# Patient Record
Sex: Male | Born: 1955 | Race: White | Hispanic: No | Marital: Married | Smoking: Former smoker
Health system: Southern US, Community
[De-identification: ages and names within clinical notes are randomized; demographics above are authoritative.]

## PROBLEM LIST (undated history)

## (undated) DIAGNOSIS — N529 Male erectile dysfunction, unspecified: Secondary | ICD-10-CM

## (undated) DIAGNOSIS — E291 Testicular hypofunction: Secondary | ICD-10-CM

## (undated) DIAGNOSIS — M51369 Other intervertebral disc degeneration, lumbar region without mention of lumbar back pain or lower extremity pain: Secondary | ICD-10-CM

## (undated) DIAGNOSIS — F1120 Opioid dependence, uncomplicated: Secondary | ICD-10-CM

## (undated) DIAGNOSIS — E782 Mixed hyperlipidemia: Secondary | ICD-10-CM

## (undated) DIAGNOSIS — I10 Essential (primary) hypertension: Secondary | ICD-10-CM

## (undated) DIAGNOSIS — F1911 Other psychoactive substance abuse, in remission: Secondary | ICD-10-CM

## (undated) DIAGNOSIS — M1990 Unspecified osteoarthritis, unspecified site: Secondary | ICD-10-CM

## (undated) DIAGNOSIS — F419 Anxiety disorder, unspecified: Secondary | ICD-10-CM

## (undated) DIAGNOSIS — M5136 Other intervertebral disc degeneration, lumbar region: Secondary | ICD-10-CM

## (undated) DIAGNOSIS — F32A Depression, unspecified: Secondary | ICD-10-CM

## (undated) DIAGNOSIS — K30 Functional dyspepsia: Secondary | ICD-10-CM

## (undated) DIAGNOSIS — F1020 Alcohol dependence, uncomplicated: Secondary | ICD-10-CM

## (undated) DIAGNOSIS — K220 Achalasia of cardia: Secondary | ICD-10-CM

## (undated) DIAGNOSIS — K219 Gastro-esophageal reflux disease without esophagitis: Secondary | ICD-10-CM

## (undated) DIAGNOSIS — T8859XA Other complications of anesthesia, initial encounter: Secondary | ICD-10-CM

## (undated) DIAGNOSIS — F1910 Other psychoactive substance abuse, uncomplicated: Secondary | ICD-10-CM

## (undated) DIAGNOSIS — K5903 Drug induced constipation: Secondary | ICD-10-CM

## (undated) DIAGNOSIS — G4730 Sleep apnea, unspecified: Secondary | ICD-10-CM

## (undated) HISTORY — PX: SHOULDER SURGERY: SHX246

## (undated) HISTORY — PX: BACK SURGERY: SHX140

## (undated) HISTORY — PX: LUMBAR SPINE SURGERY: SHX701

## (undated) HISTORY — PX: NASAL SINUS SURGERY: SHX719

---

## 1976-03-21 HISTORY — PX: ANKLE SURGERY: SHX546

## 1998-12-21 ENCOUNTER — Ambulatory Visit (HOSPITAL_COMMUNITY): Admission: RE | Admit: 1998-12-21 | Discharge: 1998-12-21 | Payer: Self-pay | Admitting: Orthopedic Surgery

## 1998-12-21 ENCOUNTER — Encounter: Payer: Self-pay | Admitting: Orthopedic Surgery

## 1998-12-28 ENCOUNTER — Encounter: Admission: RE | Admit: 1998-12-28 | Discharge: 1998-12-28 | Payer: Self-pay | Admitting: Orthopedic Surgery

## 1999-04-16 ENCOUNTER — Encounter: Admission: RE | Admit: 1999-04-16 | Discharge: 1999-05-10 | Payer: Self-pay | Admitting: Orthopedic Surgery

## 2003-05-28 ENCOUNTER — Ambulatory Visit (HOSPITAL_COMMUNITY): Admission: RE | Admit: 2003-05-28 | Discharge: 2003-05-28 | Payer: Self-pay | Admitting: Gastroenterology

## 2003-05-28 ENCOUNTER — Encounter (INDEPENDENT_AMBULATORY_CARE_PROVIDER_SITE_OTHER): Payer: Self-pay | Admitting: Specialist

## 2006-10-10 ENCOUNTER — Encounter: Admission: RE | Admit: 2006-10-10 | Discharge: 2006-10-10 | Payer: Self-pay | Admitting: Occupational Medicine

## 2009-05-06 ENCOUNTER — Emergency Department (HOSPITAL_COMMUNITY): Admission: EM | Admit: 2009-05-06 | Discharge: 2009-05-06 | Payer: Self-pay | Admitting: Emergency Medicine

## 2010-01-16 ENCOUNTER — Emergency Department (HOSPITAL_COMMUNITY): Admission: EM | Admit: 2010-01-16 | Discharge: 2010-01-16 | Payer: Self-pay | Admitting: Emergency Medicine

## 2010-04-10 ENCOUNTER — Encounter: Payer: Self-pay | Admitting: Family Medicine

## 2010-05-14 ENCOUNTER — Ambulatory Visit: Payer: Self-pay

## 2010-05-14 ENCOUNTER — Other Ambulatory Visit: Payer: Self-pay | Admitting: Occupational Medicine

## 2010-05-14 DIAGNOSIS — R52 Pain, unspecified: Secondary | ICD-10-CM

## 2010-06-02 LAB — COMPREHENSIVE METABOLIC PANEL
ALT: 26 U/L (ref 0–53)
AST: 26 U/L (ref 0–37)
Albumin: 4.2 g/dL (ref 3.5–5.2)
CO2: 30 mEq/L (ref 19–32)
Calcium: 8.8 mg/dL (ref 8.4–10.5)
GFR calc Af Amer: >60 mL/min (ref >60)
GFR calc non Af Amer: >60 mL/min (ref >60)
Sodium: 137 mEq/L (ref 135–145)
Total Protein: 6.7 g/dL (ref 6.0–8.3)

## 2010-06-02 LAB — DIFFERENTIAL
Eosinophils Absolute: 0.2 10*3/uL (ref 0.0–0.7)
Eosinophils Relative: 3 % (ref 0–5)
Lymphocytes Relative: 31 % (ref 12–46)
Lymphs Abs: 2.2 10*3/uL (ref 0.7–4.0)
Monocytes Absolute: 0.6 10*3/uL (ref 0.1–1.0)
Monocytes Relative: 8 % (ref 3–12)

## 2010-06-02 LAB — URINALYSIS, ROUTINE W REFLEX MICROSCOPIC
Bilirubin Urine: NEGATIVE
Hgb urine dipstick: NEGATIVE
Nitrite: NEGATIVE
Protein, ur: NEGATIVE mg/dL
Specific Gravity, Urine: 1.019 (ref 1.005–1.030)
Urobilinogen, UA: 0.2 mg/dL (ref 0.0–1.0)

## 2010-06-02 LAB — POCT I-STAT, CHEM 8
BUN: 8 mg/dL (ref 6–23)
Chloride: 100 mEq/L (ref 96–112)
Creatinine, Ser: 0.9 mg/dL (ref 0.4–1.5)
Glucose, Bld: 94 mg/dL (ref 70–99)
Potassium: 3.8 mEq/L (ref 3.5–5.1)
Sodium: 139 mEq/L (ref 135–145)

## 2010-06-02 LAB — ETHANOL: Alcohol, Ethyl (B): <5 mg/dL (ref 0–10)

## 2010-06-02 LAB — CBC
Hemoglobin: 14.5 g/dL (ref 13.0–17.0)
MCHC: 35.1 g/dL (ref 30.0–36.0)
Platelets: 171 10*3/uL (ref 150–400)

## 2010-06-02 LAB — ABO/RH: ABO/RH(D): B POS

## 2010-06-02 LAB — TYPE AND SCREEN: Antibody Screen: NEGATIVE

## 2010-08-06 NOTE — Op Note (Signed)
NAME:  Tyler Coleman, Tyler Coleman                     ACCOUNT NO.:  [id number]   MEDICAL RECORD NO.:  [id number]                   PATIENT TYPE:  AMB   LOCATION:  ENDO                                 FACILITY:  [facility]   PHYSICIAN:  Petra Kuba, M.D.                 DATE OF BIRTH:  July 17, 1955   DATE OF PROCEDURE:  05/28/2003  DATE OF DISCHARGE:                                 OPERATIVE REPORT   PROCEDURE:  Colonoscopy with polypectomy.   INDICATION:  Family history of colon polyps.  Consent was signed after  risks, benefits, methods, and options thoroughly discussed in the office.   MEDICINES USED:  1. Demerol 120.  2. Versed 12 mg.   DESCRIPTION OF PROCEDURE:  Rectal inspection was pertinent for external  hemorrhoids, small.  Digital exam was negative.  Video colonoscope was  inserted and easily advanced around the colon to the cecum.  This did  require some abdominal pressure but no position changes.  No obvious  abnormality was seen on insertion.  The cecum was identified by the  appendiceal orifice and the ileocecal valve.  The prep was adequate.  There  was some liquid stool that required washing and suctioning.  On slow  withdrawal through the colon, a small pedunculated cecal polyp was seen,  snared, electrocautery applied on a setting of 150/15.  The polyp was  removed, suctioned through the scope and collected in the trap.  There was a  nice, white coagulum without any obvious residual polypoid tissue.  One fold  away from the cecum, was a small polyp.  Initially, we snared it, and part  of it was removed.  After electrocautery was applied, there seemed to be a  little bit of residual polypoid tissue which was cold biopsied, but these  two polyps were put in the same container.  The scope was further withdrawn.  In the mid transverse, another tiny polyp was seen and was hot biopsied x 1.  There was another tiny one on the distal sigmoid.  Both of these were put in  separate  bottles.  No other abnormalities were seen as we slowly withdrew  back to the rectum.  Once back in the rectum, anorectal pull-through and  retroflexion confirmed some small hemorrhoids.  The scope was reinserted a  short ways up the left side of the colon; air was suctioned and scope  removed.  The patient tolerated the procedure well.  There was no obvious  immediate complication.   ENDOSCOPIC DIAGNOSES:  1. Internal-external small hemorrhoids.  2. Three tiny polyps with hot biopsies in the distal sigmoid, mid     transverse, had a snare and cold biopsy in the ascending.  3. One small polyp in the cecum snared.  4. Otherwise, within normal limits to the cecum.   PLAN:  1. Await pathology to determine future colonic screening.  2. Happy to see back p.r.n.  3. Otherwise,  return care to Dr. Doristine Counter for the customary health care     maintenance to include yearly rectals and guaiacs.                                               Petra Kuba, M.D.    MEM/MEDQ  D:  05/28/2003  T:  05/28/2003  Job:  [id number]   cc:   Marjory Lies, M.D.  [address]  [place]  [place] [address]  Fax: [phone]

## 2010-09-09 ENCOUNTER — Ambulatory Visit: Payer: Self-pay

## 2010-09-09 ENCOUNTER — Other Ambulatory Visit: Payer: Self-pay | Admitting: Occupational Medicine

## 2010-09-09 DIAGNOSIS — M549 Dorsalgia, unspecified: Secondary | ICD-10-CM

## 2014-04-14 ENCOUNTER — Other Ambulatory Visit: Payer: Self-pay | Admitting: Dermatology

## 2015-05-13 DIAGNOSIS — G4700 Insomnia, unspecified: Secondary | ICD-10-CM | POA: Insufficient documentation

## 2015-05-13 DIAGNOSIS — E291 Testicular hypofunction: Secondary | ICD-10-CM | POA: Insufficient documentation

## 2015-05-13 DIAGNOSIS — J309 Allergic rhinitis, unspecified: Secondary | ICD-10-CM | POA: Insufficient documentation

## 2015-10-28 ENCOUNTER — Encounter (HOSPITAL_COMMUNITY): Payer: Self-pay

## 2015-10-28 ENCOUNTER — Emergency Department (HOSPITAL_COMMUNITY)
Admission: EM | Admit: 2015-10-28 | Discharge: 2015-10-28 | Disposition: A | Discharge status: Home / Self Care | Payer: Commercial Managed Care - HMO | Attending: Emergency Medicine | Admitting: Emergency Medicine

## 2015-10-28 DIAGNOSIS — Z79899 Other long term (current) drug therapy: Secondary | ICD-10-CM | POA: Diagnosis not present

## 2015-10-28 DIAGNOSIS — F1490 Cocaine use, unspecified, uncomplicated: Secondary | ICD-10-CM | POA: Diagnosis not present

## 2015-10-28 DIAGNOSIS — F17210 Nicotine dependence, cigarettes, uncomplicated: Secondary | ICD-10-CM | POA: Insufficient documentation

## 2015-10-28 DIAGNOSIS — T401X1A Poisoning by heroin, accidental (unintentional), initial encounter: Secondary | ICD-10-CM | POA: Insufficient documentation

## 2015-10-28 DIAGNOSIS — R4020 Unspecified coma: Secondary | ICD-10-CM | POA: Diagnosis present

## 2015-10-28 HISTORY — DX: Other psychoactive substance abuse, in remission: F19.11

## 2015-10-28 LAB — BASIC METABOLIC PANEL
ANION GAP: 7 (ref 5–15)
BUN: 9 mg/dL (ref 6–20)
CHLORIDE: 105 mmol/L (ref 101–111)
CO2: 28 mmol/L (ref 22–32)
CREATININE: 1.02 mg/dL (ref 0.61–1.24)
Calcium: 9 mg/dL (ref 8.9–10.3)
GFR calc non Af Amer: >60 mL/min (ref >60)
Glucose, Bld: 110 mg/dL — ABNORMAL HIGH (ref 65–99)
POTASSIUM: 3.3 mmol/L — AB (ref 3.5–5.1)
Sodium: 140 mmol/L (ref 135–145)

## 2015-10-28 LAB — RAPID URINE DRUG SCREEN, HOSP PERFORMED
AMPHETAMINES: NOT DETECTED
BARBITURATES: NOT DETECTED
BENZODIAZEPINES: POSITIVE — AB
Cocaine: POSITIVE — AB
Opiates: POSITIVE — AB
TETRAHYDROCANNABINOL: NOT DETECTED

## 2015-10-28 LAB — CBC WITH DIFFERENTIAL/PLATELET
Basophils Absolute: 0 10*3/uL (ref 0.0–0.1)
Basophils Relative: 0 %
Eosinophils Absolute: 0.1 10*3/uL (ref 0.0–0.7)
Eosinophils Relative: 1 %
HEMATOCRIT: 37.9 % — AB (ref 39.0–52.0)
HEMOGLOBIN: 13.3 g/dL (ref 13.0–17.0)
LYMPHS ABS: 1.5 10*3/uL (ref 0.7–4.0)
LYMPHS PCT: 15 %
MCH: 29.6 pg (ref 26.0–34.0)
MCHC: 35.1 g/dL (ref 30.0–36.0)
MCV: 84.4 fL (ref 78.0–100.0)
MONOS PCT: 8 %
Monocytes Absolute: 0.9 10*3/uL (ref 0.1–1.0)
NEUTROS ABS: 8 10*3/uL — AB (ref 1.7–7.7)
NEUTROS PCT: 76 %
Platelets: 166 10*3/uL (ref 150–400)
RBC: 4.49 MIL/uL (ref 4.22–5.81)
RDW: 12.8 % (ref 11.5–15.5)
WBC: 10.4 10*3/uL (ref 4.0–10.5)

## 2015-10-28 LAB — ETHANOL: Alcohol, Ethyl (B): <5 mg/dL (ref <5)

## 2015-10-28 NOTE — ED Provider Notes (Signed)
[facility]-EMERGENCY DEPT Provider Note   CSN: [id number] Arrival date & time: 10/28/15  1743  First Provider Contact:  None       History   Chief Complaint Chief Complaint  Patient presents with  . Drug Overdose    HPI Tyler PileClarence K Coleman is a 60 y.o. male.  Patient is a 60 year old male brought by EMS for evaluation of an apparent overdose of heroin and Xanax. He reports snorting heroin in his vehicle where he was found unresponsive by a bystander. EMS was called. He was given Narcan and woke up shortly afterward. The patient denies to me he was making attempts to harm himself and that he inadvertently took too much. He reports a history of back pain states that he was doing this to help with his pain.   The history is provided by the patient.  Drug Overdose  This is a new problem. The current episode started less than 1 hour ago. The problem occurs constantly. The problem has been resolved. Pertinent negatives include no chest pain and no abdominal pain. Nothing aggravates the symptoms. Treatments tried: narcan. The treatment provided significant relief.    Past Medical History:  Diagnosis Date  . Hx of mixed drug abuse     There are no active problems to display for this patient.   Past Surgical History:  Procedure Laterality Date  . BACK SURGERY         Home Medications    Prior to Admission medications   Medication Sig Start Date End Date Taking? Authorizing Provider  ALPRAZolam Prudy Feeler(XANAX) 0.5 MG tablet Take 0.5-1 mg by mouth 4 (four) times daily as needed for anxiety.  10/12/15  Yes Historical Provider, MD  pantoprazole (PROTONIX) 40 MG tablet Take 40 mg by mouth daily. 09/25/15  Yes Historical Provider, MD  SUBOXONE 2-0.5 MG FILM Place 0.25 Film under the tongue daily. 09/30/15  Yes Historical Provider, MD  VIIBRYD 40 MG TABS Take 40 mg by mouth daily. 09/28/15  Yes Historical Provider, MD  zolpidem (AMBIEN) 10 MG tablet Take 10 mg by mouth at bedtime as needed for sleep.   10/02/15  Yes Historical Provider, MD    Family History Family History  Problem Relation Age of Onset  . Heart failure Mother   . Heart failure Father     Social History Social History  Substance Use Topics  . Smoking status: Light Tobacco Smoker    Types: Cigarettes  . Smokeless tobacco: Never Used  . Alcohol use 1.8 oz/week    3 Cans of beer per week     Allergies   Review of patient's allergies indicates no known allergies.   Review of Systems Review of Systems  Cardiovascular: Negative for chest pain.  Gastrointestinal: Negative for abdominal pain.  All other systems reviewed and are negative.    Physical Exam Updated Vital Signs BP 127/70 (BP Location: Left Arm)   Temp 98.6 F (37 C)   Resp 20   Ht 6' (1.829 m)   Wt 215 lb (97.5 kg)   SpO2 98%   BMI 29.16 kg/m   Physical Exam  Constitutional: He is oriented to person, place, and time. He appears well-developed and well-nourished. No distress.  HENT:  Head: Normocephalic and atraumatic.  Mouth/Throat: Oropharynx is clear and moist.  Neck: Normal range of motion. Neck supple.  Cardiovascular: Normal rate and regular rhythm.  Exam reveals no friction rub.   No murmur heard. Pulmonary/Chest: Effort normal and breath sounds normal. No  respiratory distress. He has no wheezes. He has no rales.  Abdominal: Soft. Bowel sounds are normal. He exhibits no distension. There is no tenderness.  Musculoskeletal: Normal range of motion. He exhibits no edema.  Neurological: He is alert and oriented to person, place, and time. Coordination normal.  Skin: Skin is warm and dry. He is not diaphoretic.  Nursing note and vitals reviewed.    ED Treatments / Results  Labs (all labs ordered are listed, but only abnormal results are displayed) Labs Reviewed  BASIC METABOLIC PANEL  CBC WITH DIFFERENTIAL/PLATELET  ETHANOL  URINE RAPID DRUG SCREEN, HOSP PERFORMED    EKG  EKG Interpretation None       Radiology No  results found.  Procedures Procedures (including critical care time)  Medications Ordered in ED Medications - No data to display   Initial Impression / Assessment and Plan / ED Course  I have reviewed the triage vital signs and the nursing notes.  Pertinent labs & imaging results that were available during my care of the patient were reviewed by me and considered in my medical decision making (see chart for details).  Clinical Course      Final Clinical Impressions(s) / ED Diagnoses   Final diagnoses:  None   Patient denies this was a suicide attempt. He is becoming agitated and demanding to go home. He has been observed for the past 2 hours and appears to be doing well. He is conscious and alert after the Narcan has worn off. He will be discharged, to return as needed for any problems.  New Prescriptions New Prescriptions   No medications on file     Geoffery Lyons, MD 10/28/15 1945

## 2015-10-28 NOTE — ED Notes (Signed)
Bed: [id number] Expected date:  Expected time:  Means of arrival:  Comments: 60 yo heroin OD

## 2015-10-28 NOTE — Progress Notes (Addendum)
Patient listed as not having insurance or a pcp.  EDCM spoke to patient at bedside.  Patient reports his pcp is Dr. Doristine CounterBurnett. Patient reports he has insurance.   [facility].

## 2015-10-28 NOTE — ED Triage Notes (Signed)
Per EMS Patient was found by a lifeguard in a parking lot [place] a community [place] unresponsive. Patient was given Narcan 2 mg IN and now patient awake and alert. Patient admitted to using heroin and xanax for complaints of back pain.

## 2016-02-16 DIAGNOSIS — E782 Mixed hyperlipidemia: Secondary | ICD-10-CM | POA: Insufficient documentation

## 2016-03-14 ENCOUNTER — Emergency Department (HOSPITAL_COMMUNITY)
Admission: EM | Admit: 2016-03-14 | Discharge: 2016-03-15 | Disposition: A | Discharge status: Other Acute Inpatient Hospital | Payer: Commercial Managed Care - HMO | Attending: Emergency Medicine | Admitting: Emergency Medicine

## 2016-03-14 DIAGNOSIS — F332 Major depressive disorder, recurrent severe without psychotic features: Secondary | ICD-10-CM | POA: Insufficient documentation

## 2016-03-14 DIAGNOSIS — F17210 Nicotine dependence, cigarettes, uncomplicated: Secondary | ICD-10-CM | POA: Insufficient documentation

## 2016-03-14 DIAGNOSIS — Z79899 Other long term (current) drug therapy: Secondary | ICD-10-CM | POA: Insufficient documentation

## 2016-03-14 DIAGNOSIS — R45851 Suicidal ideations: Secondary | ICD-10-CM | POA: Diagnosis present

## 2016-03-14 LAB — CBC
HCT: 39 % (ref 39.0–52.0)
Hemoglobin: 13.9 g/dL (ref 13.0–17.0)
MCH: 30 pg (ref 26.0–34.0)
MCHC: 35.6 g/dL (ref 30.0–36.0)
MCV: 84.1 fL (ref 78.0–100.0)
PLATELETS: 196 10*3/uL (ref 150–400)
RBC: 4.64 MIL/uL (ref 4.22–5.81)
RDW: 12.6 % (ref 11.5–15.5)
WBC: 6.1 10*3/uL (ref 4.0–10.5)

## 2016-03-14 LAB — COMPREHENSIVE METABOLIC PANEL
ALBUMIN: 4.7 g/dL (ref 3.5–5.0)
ALT: 26 U/L (ref 17–63)
ANION GAP: 12 (ref 5–15)
AST: 25 U/L (ref 15–41)
Alkaline Phosphatase: 68 U/L (ref 38–126)
BUN: 9 mg/dL (ref 6–20)
CHLORIDE: 102 mmol/L (ref 101–111)
CO2: 25 mmol/L (ref 22–32)
Calcium: 8.8 mg/dL — ABNORMAL LOW (ref 8.9–10.3)
Creatinine, Ser: 0.81 mg/dL (ref 0.61–1.24)
GFR calc Af Amer: >60 mL/min (ref >60)
GFR calc non Af Amer: >60 mL/min (ref >60)
Glucose, Bld: 104 mg/dL — ABNORMAL HIGH (ref 65–99)
POTASSIUM: 3.3 mmol/L — AB (ref 3.5–5.1)
Sodium: 139 mmol/L (ref 135–145)
Total Bilirubin: 0.5 mg/dL (ref 0.3–1.2)
Total Protein: 7 g/dL (ref 6.5–8.1)

## 2016-03-14 LAB — ETHANOL: ALCOHOL ETHYL (B): 211 mg/dL — AB (ref <5)

## 2016-03-14 LAB — SALICYLATE LEVEL

## 2016-03-14 LAB — RAPID URINE DRUG SCREEN, HOSP PERFORMED
AMPHETAMINES: NOT DETECTED
BENZODIAZEPINES: NOT DETECTED
Barbiturates: NOT DETECTED
COCAINE: NOT DETECTED
OPIATES: NOT DETECTED
Tetrahydrocannabinol: NOT DETECTED

## 2016-03-14 LAB — ACETAMINOPHEN LEVEL

## 2016-03-14 NOTE — ED Notes (Signed)
Pt cursing and yelling at police and staff, calling us "chicken shits" and being uncooperative.

## 2016-03-14 NOTE — ED Provider Notes (Signed)
[facility]-EMERGENCY DEPT Provider Note   CSN: [id number] Arrival date & time: 03/14/16  2216     History   Chief Complaint Chief Complaint  Patient presents with  . Suicidal    HPI Tyler PileClarence K Coleman is a 60 y.o. male.  60 year old male who presents in custody of police after threatening to bear cans of his house as well as shoot other officers. Patient denies alcohol use this evening or illicit drug use. According to police, when they ran his house they found a large amount of guns. Patient reportedly had been requested for police to shoot him. He denies any suicidal or homicidal ideations. History is limited due to his current state.      Past Medical History:  Diagnosis Date  . Hx of mixed drug abuse     There are no active problems to display for this patient.   Past Surgical History:  Procedure Laterality Date  . BACK SURGERY         Home Medications    Prior to Admission medications   Medication Sig Start Date End Date Taking? Authorizing Provider  ALPRAZolam Prudy Feeler(XANAX) 0.5 MG tablet Take 0.5-1 mg by mouth 4 (four) times daily as needed for anxiety.  10/12/15   Historical Provider, MD  pantoprazole (PROTONIX) 40 MG tablet Take 40 mg by mouth daily. 09/25/15   Historical Provider, MD  SUBOXONE 2-0.5 MG FILM Place 0.25 Film under the tongue daily. 09/30/15   Historical Provider, MD  VIIBRYD 40 MG TABS Take 40 mg by mouth daily. 09/28/15   Historical Provider, MD  zolpidem (AMBIEN) 10 MG tablet Take 10 mg by mouth at bedtime as needed for sleep.  10/02/15   Historical Provider, MD    Family History Family History  Problem Relation Age of Onset  . Heart failure Mother   . Heart failure Father     Social History Social History  Substance Use Topics  . Smoking status: Light Tobacco Smoker    Types: Cigarettes  . Smokeless tobacco: Never Used  . Alcohol use 1.8 oz/week    3 Cans of beer per week     Allergies   Patient has no known allergies.   Review of  Systems Review of Systems  Unable to perform ROS: Psychiatric disorder     Physical Exam Updated Vital Signs BP 163/87   Pulse 89   Resp 17   SpO2 96%   Physical Exam  Constitutional: He is oriented to person, place, and time. He appears well-developed and well-nourished.  Non-toxic appearance. No distress.  HENT:  Head: Normocephalic and atraumatic.  Eyes: Conjunctivae, EOM and lids are normal. Pupils are equal, round, and reactive to light.  Neck: Normal range of motion. Neck supple. No tracheal deviation present. No thyroid mass present.  Cardiovascular: Normal rate, regular rhythm and normal heart sounds.  Exam reveals no gallop.   No murmur heard. Pulmonary/Chest: Effort normal and breath sounds normal. No stridor. No respiratory distress. He has no decreased breath sounds. He has no wheezes. He has no rhonchi. He has no rales.  Abdominal: Soft. Normal appearance and bowel sounds are normal. He exhibits no distension. There is no tenderness. There is no rebound and no CVA tenderness.  Musculoskeletal: Normal range of motion. He exhibits no edema or tenderness.  Neurological: He is alert and oriented to person, place, and time. He has normal strength. No cranial nerve deficit or sensory deficit. GCS eye subscore is 4. GCS verbal subscore is 5. GCS motor  subscore is 6.  Skin: Skin is warm and dry. No abrasion and no rash noted.  Psychiatric: His affect is angry. His speech is rapid and/or pressured. He is aggressive and combative. He expresses impulsivity. He is inattentive.  Nursing note and vitals reviewed.    ED Treatments / Results  Labs (all labs ordered are listed, but only abnormal results are displayed) Labs Reviewed  COMPREHENSIVE METABOLIC PANEL  ETHANOL  SALICYLATE LEVEL  ACETAMINOPHEN LEVEL  CBC  RAPID URINE DRUG SCREEN, HOSP PERFORMED    EKG  EKG Interpretation None       Radiology No results found.  Procedures Procedures (including critical care  time)  Medications Ordered in ED Medications - No data to display   Initial Impression / Assessment and Plan / ED Course  I have reviewed the triage vital signs and the nursing notes.  Pertinent labs & imaging results that were available during my care of the patient were reviewed by me and considered in my medical decision making (see chart for details).  Clinical Course     The patient's medical clearance labs are pending at this time and will be dispositioned by oncoming provider  Final Clinical Impressions(s) / ED Diagnoses   Final diagnoses:  None    New Prescriptions New Prescriptions   No medications on file     Lorre NickAnthony Itzy Adler, MD 03/14/16 2311

## 2016-03-14 NOTE — ED Notes (Addendum)
RN is drawing blood work 

## 2016-03-14 NOTE — ED Notes (Signed)
[facility]. Extremely intoxicated, called 911 and threatened to shoot self and any police if they showed up. Guns and covered windows found in home like he wanted to have a standoff with police. Pt has been repeatedly requesting police to shoot him.

## 2016-03-15 ENCOUNTER — Inpatient Hospital Stay (HOSPITAL_COMMUNITY)
Admission: AD | Admit: 2016-03-15 | Discharge: 2016-03-21 | DRG: 885 | Disposition: A | Discharge status: Home / Self Care | Payer: 59 | Source: Intra-hospital | Attending: Psychiatry | Admitting: Psychiatry

## 2016-03-15 DIAGNOSIS — Z79899 Other long term (current) drug therapy: Secondary | ICD-10-CM

## 2016-03-15 DIAGNOSIS — F332 Major depressive disorder, recurrent severe without psychotic features: Secondary | ICD-10-CM | POA: Diagnosis present

## 2016-03-15 DIAGNOSIS — F17210 Nicotine dependence, cigarettes, uncomplicated: Secondary | ICD-10-CM | POA: Diagnosis present

## 2016-03-15 DIAGNOSIS — Z818 Family history of other mental and behavioral disorders: Secondary | ICD-10-CM | POA: Diagnosis not present

## 2016-03-15 DIAGNOSIS — G4700 Insomnia, unspecified: Secondary | ICD-10-CM | POA: Diagnosis present

## 2016-03-15 DIAGNOSIS — K219 Gastro-esophageal reflux disease without esophagitis: Secondary | ICD-10-CM | POA: Diagnosis present

## 2016-03-15 DIAGNOSIS — Z87891 Personal history of nicotine dependence: Secondary | ICD-10-CM | POA: Diagnosis not present

## 2016-03-15 DIAGNOSIS — F1020 Alcohol dependence, uncomplicated: Secondary | ICD-10-CM | POA: Diagnosis present

## 2016-03-15 DIAGNOSIS — Z8249 Family history of ischemic heart disease and other diseases of the circulatory system: Secondary | ICD-10-CM

## 2016-03-15 DIAGNOSIS — F411 Generalized anxiety disorder: Secondary | ICD-10-CM | POA: Diagnosis present

## 2016-03-15 DIAGNOSIS — Z7982 Long term (current) use of aspirin: Secondary | ICD-10-CM | POA: Diagnosis not present

## 2016-03-15 LAB — ETHANOL: ALCOHOL ETHYL (B): 157 mg/dL — AB (ref <5)

## 2016-03-15 MED ORDER — TRAZODONE HCL 100 MG PO TABS
100.0000 mg | ORAL_TABLET | Freq: Every evening | ORAL | Status: DC | PRN
  Administered 2016-03-15 – 2016-03-16 (×4): 100 mg via ORAL
  Filled 2016-03-15 (×8): qty 1

## 2016-03-15 MED ORDER — ALUM & MAG HYDROXIDE-SIMETH 200-200-20 MG/5ML PO SUSP
30.0000 mL | ORAL | Status: DC | PRN
  Administered 2016-03-15: 30 mL via ORAL
  Filled 2016-03-15: qty 30

## 2016-03-15 MED ORDER — LORAZEPAM 1 MG PO TABS: 0.0000 mg | ORAL_TABLET | Freq: Two times a day (BID) | ORAL | Status: DC

## 2016-03-15 MED ORDER — ASPIRIN EC 81 MG PO TBEC
81.0000 mg | DELAYED_RELEASE_TABLET | Freq: Every day | ORAL | Status: DC
  Administered 2016-03-16 – 2016-03-21 (×6): 81 mg via ORAL
  Filled 2016-03-15 (×8): qty 1

## 2016-03-15 MED ORDER — ZIPRASIDONE MESYLATE 20 MG IM SOLR
INTRAMUSCULAR | Status: AC
  Filled 2016-03-15: qty 20

## 2016-03-15 MED ORDER — LORAZEPAM 1 MG PO TABS
0.0000 mg | ORAL_TABLET | Freq: Four times a day (QID) | ORAL | Status: AC
  Administered 2016-03-15 – 2016-03-17 (×5): 1 mg via ORAL
  Filled 2016-03-15 (×7): qty 1

## 2016-03-15 MED ORDER — NICOTINE 21 MG/24HR TD PT24
21.0000 mg | MEDICATED_PATCH | Freq: Every day | TRANSDERMAL | Status: DC
  Filled 2016-03-15: qty 1

## 2016-03-15 MED ORDER — VITAMIN B-1 100 MG PO TABS
100.0000 mg | ORAL_TABLET | Freq: Every day | ORAL | Status: DC
  Administered 2016-03-15: 100 mg via ORAL
  Filled 2016-03-15: qty 1

## 2016-03-15 MED ORDER — ALPRAZOLAM 0.5 MG PO TABS: 0.5000 mg | ORAL_TABLET | Freq: Four times a day (QID) | ORAL | Status: DC | PRN

## 2016-03-15 MED ORDER — PANTOPRAZOLE SODIUM 40 MG PO TBEC
40.0000 mg | DELAYED_RELEASE_TABLET | Freq: Every day | ORAL | Status: DC
  Administered 2016-03-16 – 2016-03-21 (×6): 40 mg via ORAL
  Filled 2016-03-15 (×9): qty 1

## 2016-03-15 MED ORDER — LORAZEPAM 1 MG PO TABS
0.0000 mg | ORAL_TABLET | Freq: Four times a day (QID) | ORAL | Status: DC
  Administered 2016-03-15: 1 mg via ORAL
  Administered 2016-03-15: 2 mg via ORAL
  Filled 2016-03-15: qty 1
  Filled 2016-03-15: qty 2

## 2016-03-15 MED ORDER — IBUPROFEN 600 MG PO TABS
600.0000 mg | ORAL_TABLET | Freq: Three times a day (TID) | ORAL | Status: DC | PRN
  Administered 2016-03-16 – 2016-03-20 (×5): 600 mg via ORAL
  Filled 2016-03-15 (×5): qty 1

## 2016-03-15 MED ORDER — ONDANSETRON HCL 4 MG PO TABS
4.0000 mg | ORAL_TABLET | Freq: Three times a day (TID) | ORAL | Status: DC | PRN
  Administered 2016-03-18: 4 mg via ORAL
  Filled 2016-03-15: qty 1

## 2016-03-15 MED ORDER — THIAMINE HCL 100 MG/ML IJ SOLN: 100.0000 mg | Freq: Every day | INTRAMUSCULAR | Status: DC

## 2016-03-15 MED ORDER — IBUPROFEN 200 MG PO TABS
600.0000 mg | ORAL_TABLET | Freq: Three times a day (TID) | ORAL | Status: DC | PRN
  Administered 2016-03-15: 600 mg via ORAL
  Filled 2016-03-15: qty 3

## 2016-03-15 MED ORDER — MAGNESIUM HYDROXIDE 400 MG/5ML PO SUSP: 30.0000 mL | Freq: Every day | ORAL | Status: DC | PRN

## 2016-03-15 MED ORDER — PANTOPRAZOLE SODIUM 40 MG PO TBEC
40.0000 mg | DELAYED_RELEASE_TABLET | Freq: Every day | ORAL | Status: DC
  Administered 2016-03-15: 40 mg via ORAL
  Filled 2016-03-15: qty 1

## 2016-03-15 MED ORDER — NICOTINE 21 MG/24HR TD PT24
21.0000 mg | MEDICATED_PATCH | Freq: Every day | TRANSDERMAL | Status: DC
  Administered 2016-03-17 – 2016-03-21 (×5): 21 mg via TRANSDERMAL
  Filled 2016-03-15 (×8): qty 1

## 2016-03-15 MED ORDER — VILAZODONE HCL 40 MG PO TABS
40.0000 mg | ORAL_TABLET | Freq: Every day | ORAL | Status: DC
  Administered 2016-03-15: 40 mg via ORAL
  Filled 2016-03-15: qty 1

## 2016-03-15 MED ORDER — VILAZODONE HCL 40 MG PO TABS
40.0000 mg | ORAL_TABLET | Freq: Every day | ORAL | Status: DC
  Administered 2016-03-16: 40 mg via ORAL
  Filled 2016-03-15 (×2): qty 1

## 2016-03-15 MED ORDER — ZOLPIDEM TARTRATE 5 MG PO TABS: 5.0000 mg | ORAL_TABLET | Freq: Every evening | ORAL | Status: DC | PRN

## 2016-03-15 MED ORDER — LORAZEPAM 1 MG PO TABS
0.0000 mg | ORAL_TABLET | Freq: Two times a day (BID) | ORAL | Status: AC
  Administered 2016-03-17 – 2016-03-18 (×4): 1 mg via ORAL
  Filled 2016-03-15 (×3): qty 1

## 2016-03-15 MED ORDER — PANTOPRAZOLE SODIUM 40 MG PO TBEC: 40.0000 mg | DELAYED_RELEASE_TABLET | Freq: Every day | ORAL | Status: DC

## 2016-03-15 MED ORDER — ZOLPIDEM TARTRATE 10 MG PO TABS: 10.0000 mg | ORAL_TABLET | Freq: Every evening | ORAL | Status: DC | PRN

## 2016-03-15 MED ORDER — ZIPRASIDONE MESYLATE 20 MG IM SOLR
10.0000 mg | Freq: Once | INTRAMUSCULAR | Status: AC
  Administered 2016-03-15: 10 mg via INTRAMUSCULAR

## 2016-03-15 MED ORDER — ACETAMINOPHEN 325 MG PO TABS: 650.0000 mg | ORAL_TABLET | ORAL | Status: DC | PRN

## 2016-03-15 MED ORDER — ONDANSETRON HCL 4 MG PO TABS: 4.0000 mg | ORAL_TABLET | Freq: Three times a day (TID) | ORAL | Status: DC | PRN

## 2016-03-15 MED ORDER — ACETAMINOPHEN 325 MG PO TABS
650.0000 mg | ORAL_TABLET | ORAL | Status: DC | PRN
  Administered 2016-03-15 – 2016-03-20 (×6): 650 mg via ORAL
  Filled 2016-03-15 (×6): qty 2

## 2016-03-15 MED ORDER — ALUM & MAG HYDROXIDE-SIMETH 200-200-20 MG/5ML PO SUSP
30.0000 mL | ORAL | Status: DC | PRN
  Administered 2016-03-19: 30 mL via ORAL
  Filled 2016-03-15: qty 30

## 2016-03-15 MED ORDER — STERILE WATER FOR INJECTION IJ SOLN
INTRAMUSCULAR | Status: AC
  Administered 2016-03-15: 10 mL
  Filled 2016-03-15: qty 10

## 2016-03-15 MED ORDER — VITAMIN B-1 100 MG PO TABS
100.0000 mg | ORAL_TABLET | Freq: Every day | ORAL | Status: DC
  Administered 2016-03-16 – 2016-03-21 (×6): 100 mg via ORAL
  Filled 2016-03-15 (×8): qty 1

## 2016-03-15 NOTE — BH Assessment (Signed)
[facility] Assessment Progress Note  Per Nelly RoutArchana Kumar, MD, this pt requires psychiatric hospitalization.  Lillia AbedLindsay, RN, [facility]  Pt presents under IVC initiated by his son, and upheld by Dr Lucianne MussKumar, and [facility].  Pt's nurse, Kendal Hymendie, has been notified, and agrees to call report to [phone].  Pt is to be transported via [employer].   Doylene Canninghomas Muzamil Harker, MA Triage Specialist [phone]

## 2016-03-15 NOTE — ED Notes (Signed)
[facility]  Pt was cuffed but was calm and cooperative.  All belongings were sent with patient.

## 2016-03-15 NOTE — BH Assessment (Signed)
[facility] Assessment Progress Note   Clinician attempted to assess patient but he is too sleepy to be assessed at this time.  Nurse Clair GullingNicki said that patient had been given geodon earlier.  Clinician talked to Dr. Shanna CiscoPalina who said he would take out the consult order.  TTS to assess later.

## 2016-03-15 NOTE — BH Assessment (Signed)
Assessment Note  Tyler Coleman is an 60 y.o. male brought by [facility] department. Patient presented to [facility] with IVC. Writer met with patient face to face. Sts that he is unaware of why he was brought to [facility]. Sts, "The last thing I remember doing is drinking". Patient explains that he is alcoholic. He reports 10 yrs of sobriety.  Patient relapsed 3x's starting the early part of December. He reports increased depression about his relapsed. He reports feelings of worthlessness, hopelessness, loss of interest in usual pleasures, and fatigue. He admits that he has felt depressed. He doesn't know if he had intentions of trying to harm himself yesterday. Per ED notes, "Patient xxtremely intoxicated, called 911 and threatened to shoot self and any police if they showed up. Guns and covered windows found in home like he wanted to have a standoff with police. Pt has been repeatedly requesting police to shoot him." Patient admits that he owns several guns. He admits that he doesn't trust himself and plans to give his guns to his son to keep. He denies HI. No history of harm to others. He is facing legal charges for a DUI. He has a related court date 04/01/2016. No AVH's. Patient unsure if he recalls previous INPT mental health substance abuse treatment.  No outpatient psychiatrist or therapist reported.   Diagnosis: Major Depressive Disorder, Recurrent, Severe, without psychotic features; Substance Induced Mood Disorder; Alcohol, Severe  Past Medical History:  Past Medical History:  Diagnosis Date  . Hx of mixed drug abuse     Past Surgical History:  Procedure Laterality Date  . BACK SURGERY      Family History:  Family History  Problem Relation Age of Onset  . Heart failure Mother   . Heart failure Father     Social History:  reports that he has been smoking Cigarettes.  He has never used smokeless tobacco. He reports that he drinks about 1.8 oz of alcohol per week . He reports that he uses  drugs, including Cocaine.  Additional Social History:  Alcohol / Drug Use Pain Medications: SEE MAR Prescriptions: SEE MAR Over the Counter: SEE MAR History of alcohol / drug use?: Yes Longest period of sobriety (when/how long): 10 yrs of sobriety until 02/2016 Withdrawal Symptoms: Sweats, Weakness, Irritability Substance #1 Name of Substance 1: Alcohol  1 - Age of First Use: 60 yrs old  1 - Amount (size/oz): "couple of 6 packs" or "alcohol binges" 1 - Frequency: 2-3 times in the past month after 10 yrs of sobriety 1 - Duration: on-going  1 - Last Use / Amount: 03/15/2016  CIWA: CIWA-Ar BP: 155/79 Pulse Rate: 98 Nausea and Vomiting: mild nausea with no vomiting Tactile Disturbances: none Tremor: not visible, but can be felt fingertip to fingertip Auditory Disturbances: not present Paroxysmal Sweats: barely perceptible sweating, palms moist Visual Disturbances: not present Anxiety: three Headache, Fullness in Head: none present Agitation: somewhat more than normal activity Orientation and Clouding of Sensorium: oriented and can do serial additions CIWA-Ar Total: 7 COWS:    Allergies: No Known Allergies  Home Medications:  (Not in a hospital admission)  OB/GYN Status:  No LMP for male patient.  General Assessment Data Location of Assessment: [facility] TTS Assessment: In system Is this a Tele or Face-to-Face Assessment?: Face-to-Face Is this an Initial Assessment or a Re-assessment for this encounter?: Initial Assessment Marital status: Single Maiden name:  (n/a) Is patient pregnant?: No Pregnancy Status: No Living Arrangements: Alone Can pt return to  current living arrangement?: Yes Admission Status: Voluntary Is patient capable of signing voluntary admission?: Yes Referral Source: Other Insurance type:  ([place])     Crisis Care Plan Living Arrangements: Alone Legal Guardian: Other: (no legal guardian ) Name of Psychiatrist:  (no psychiatrist ) Name  of Therapist:  (no therapist )  Education Status Is patient currently in school?: No Current Grade:  (no ) Highest grade of school patient has completed:  (unk) Name of school:  (unk) Contact person:  (unk)  Risk to self with the past 6 months Suicidal Ideation: Yes-Currently Present Has patient been a risk to self within the past 6 months prior to admission? : Yes Suicidal Intent: Yes-Currently Present Has patient had any suicidal intent within the past 6 months prior to admission? : Yes Is patient at risk for suicide?: Yes Suicidal Plan?: Yes-Currently Present Has patient had any suicidal plan within the past 6 months prior to admission? : Yes Specify Current Suicidal Plan:  ("have a stand off with police") Access to Means: Yes Specify Access to Suicidal Means:  (owns several guns) What has been your use of drugs/alcohol within the last 12 months?:  (alcohol) Previous Attempts/Gestures: No How many times?:  (denies prior attempts or gestures ) Other Self Harm Risks:  (denies ) Triggers for Past Attempts: Other (Comment) (no previous attempts or gestures ) Intentional Self Injurious Behavior: None Family Suicide History: No Recent stressful life event(s): Other (Comment) ("I relapsed") Persecutory voices/beliefs?: No Depression: Yes Depression Symptoms: Feeling angry/irritable, Feeling worthless/self pity, Guilt, Fatigue, Isolating, Despondent Substance abuse history and/or treatment for substance abuse?: No Suicide prevention information given to non-admitted patients: Not applicable  Risk to Others within the past 6 months Homicidal Ideation: No Does patient have any lifetime risk of violence toward others beyond the six months prior to admission? : No Thoughts of Harm to Others: No Current Homicidal Intent: No Current Homicidal Plan: No Access to Homicidal Means: No Identified Victim:  (n/a) History of harm to others?: No Assessment of Violence: None Noted Violent  Behavior Description:  (patient is calm and cooperative ) Does patient have access to weapons?: No Criminal Charges Pending?: No Does patient have a court date: No Is patient on probation?: No  Psychosis Hallucinations: None noted Delusions: None noted  Mental Status Report Appearance/Hygiene: In scrubs Eye Contact: Fair Motor Activity: Freedom of movement Speech: Logical/coherent Level of Consciousness: Restless, Irritable Mood: Depressed Affect: Depressed, Irritable Anxiety Level: Moderate Thought Processes: Relevant Judgement: Impaired Orientation: Person, Place, Time, Situation Obsessive Compulsive Thoughts/Behaviors: None  Cognitive Functioning Concentration: Decreased Memory: Remote Intact, Recent Intact IQ: Average Insight: Poor Impulse Control: Poor Appetite: Fair Weight Loss:  (none reported) Weight Gain:  (none reported) Sleep: Decreased Total Hours of Sleep:  (varies ) Vegetative Symptoms: None  ADLScreening [facility] Assessment Services) Patient's cognitive ability adequate to safely complete daily activities?: Yes Patient able to express need for assistance with ADLs?: Yes Independently performs ADLs?: Yes (appropriate for developmental age)  Prior Inpatient Therapy Prior Inpatient Therapy: Yes Prior Therapy Dates:  ("I can't remember") Prior Therapy Facilty/Provider(s):  ("I don't know") Reason for Treatment:  ("My drinking I guess")  Prior Outpatient Therapy Prior Outpatient Therapy: No Prior Therapy Dates:  (n/a) Prior Therapy Facilty/Provider(s):  (n/a) Reason for Treatment:  (n/a) Does patient have an ACCT team?: No Does patient have Intensive In-House Services?  : No Does patient have [facility] services? : No Does patient have P4CC services?: No  ADL Screening (condition at time of  admission) Patient's cognitive ability adequate to safely complete daily activities?: Yes Is the patient deaf or have difficulty hearing?: No Does the patient have  difficulty seeing, even when wearing glasses/contacts?: No Does the patient have difficulty concentrating, remembering, or making decisions?: No Patient able to express need for assistance with ADLs?: Yes Does the patient have difficulty dressing or bathing?: No Independently performs ADLs?: Yes (appropriate for developmental age) Does the patient have difficulty walking or climbing stairs?: No Weakness of Legs: None Weakness of Arms/Hands: None  Home Assistive Devices/Equipment Home Assistive Devices/Equipment: None    Abuse/Neglect Assessment (Assessment to be complete while patient is alone) Physical Abuse: Denies Verbal Abuse: Denies Sexual Abuse: Denies Exploitation of patient/patient's resources: Denies Self-Neglect: Denies Values / Beliefs Cultural Requests During Hospitalization: None Spiritual Requests During Hospitalization: None   Advance Directives (For Healthcare) Does Patient Have a Medical Advance Directive?: No Would patient like information on creating a medical advance directive?: No - Patient declined Nutrition Screen- [facility] Patient's home diet: Regular  Additional Information 1:1 In Past 12 Months?: No CIRT Risk: No Elopement Risk: No Does patient have medical clearance?: Yes     Disposition:  Disposition Initial Assessment Completed for this Encounter: Yes Disposition of Patient: Inpatient treatment program (Per Dr. Dwyane Dee and Theodoro Clock .Marland KitchenINPT criteria ) Type of inpatient treatment program: Adult  On Site Evaluation by:   Reviewed with Physician:    Waldon Merl 03/15/2016 5:09 PM

## 2016-03-15 NOTE — BH Assessment (Signed)
Per shift report, patient unable to be assessed. Patient given Geodon and sleeping. Writer made another attempt to assess patient approximately [phone]. Patient was unable to be aroused. Patient laying in bed with covers over head. He would not respond to this writer calling his name. Writer will attempt to reassess again this afternoon or when patient arouses.

## 2016-03-15 NOTE — ED Notes (Signed)
Pt changed into scrubs + wanded 

## 2016-03-15 NOTE — ED Notes (Signed)
Patient has been sleeping all morning.  I finally got him to wake up and take his medications.  He took them willingly but he was very irritable.  He claims he was never suicidal.  He has multiple bruises and scratches on his forearms.  15 minute checks and video monitoring continue.

## 2016-03-15 NOTE — Progress Notes (Signed)
03/15/16 1351:  LRT went to pt room to offer activities, pt was sleep.  Caroll RancherMarjette Raheel Kunkle, LRT/CTRS

## 2016-03-16 ENCOUNTER — Encounter (HOSPITAL_COMMUNITY): Payer: Self-pay

## 2016-03-16 DIAGNOSIS — F1020 Alcohol dependence, uncomplicated: Secondary | ICD-10-CM | POA: Diagnosis present

## 2016-03-16 MED ORDER — BUPROPION HCL ER (XL) 150 MG PO TB24
150.0000 mg | ORAL_TABLET | Freq: Every day | ORAL | Status: DC
  Administered 2016-03-16 – 2016-03-21 (×6): 150 mg via ORAL
  Filled 2016-03-16 (×9): qty 1

## 2016-03-16 MED ORDER — LOPERAMIDE HCL 2 MG PO CAPS
4.0000 mg | ORAL_CAPSULE | ORAL | Status: DC | PRN
  Administered 2016-03-16 – 2016-03-17 (×2): 4 mg via ORAL
  Filled 2016-03-16 (×2): qty 2

## 2016-03-16 NOTE — Tx Team (Addendum)
Initial Treatment Plan 03/16/2016 3:35 AM Tyler Pilelarence K Whitsitt WUJ:[id number]    PATIENT STRESSORS: Marital or family conflict Substance abuse   PATIENT STRENGTHS: Capable of independent living Physical Health Supportive family/friends   PATIENT IDENTIFIED PROBLEMS: Substance Abuse  "I drink too much, and I need help geeting it under control"  Anger Management  "I need to find better ways to handle myself when I get mad"  ETOH abuse             DISCHARGE CRITERIA:  Ability to meet basic life and health needs Adequate post-discharge living arrangements Improved stabilization in mood, thinking, and/or behavior  PRELIMINARY DISCHARGE PLAN: Participate in family therapy Return to previous living arrangement  PATIENT/FAMILY INVOLVEMENT: This treatment plan has been presented to and reviewed with the patient, Tyler Coleman.  The patient and family have been given the opportunity to ask questions and make suggestions.  Jonetta SpeakAshton E Barker, RN 03/16/2016, 3:35 AM

## 2016-03-16 NOTE — Tx Team (Cosign Needed)
Interdisciplinary Treatment and Diagnostic Plan Update  03/16/2016 Time of Session: 9:30AM Tyler PileClarence K Benney MRN: [id number]  Principal Diagnosis: MDD (major depressive disorder), recurrent severe, without psychosis (HCC)  Secondary Diagnoses: Principal Problem:   MDD (major depressive disorder), recurrent severe, without psychosis (HCC) Active Problems:   Alcohol use disorder, moderate, dependence (HCC)   Current Medications:  Current Facility-Administered Medications  Medication Dose Route Frequency Provider Last Rate Last Dose  . acetaminophen (TYLENOL) tablet 650 mg  650 mg Oral Q4H PRN Charm RingsJamison Y Lord, NP   650 mg at 03/15/16 2122  . alum & mag hydroxide-simeth (MAALOX/MYLANTA) 200-200-20 MG/5ML suspension 30 mL  30 mL Oral PRN Charm RingsJamison Y Lord, NP      . aspirin EC tablet 81 mg  81 mg Oral Daily Charm RingsJamison Y Lord, NP   81 mg at 03/16/16 0919  . ibuprofen (ADVIL,MOTRIN) tablet 600 mg  600 mg Oral Q8H PRN Charm RingsJamison Y Lord, NP   600 mg at 03/16/16 [id number]  . LORazepam (ATIVAN) tablet 0-4 mg  0-4 mg Oral Q6H Charm RingsJamison Y Lord, NP   Stopped at 03/15/16 2255   Followed by  . [START ON 03/17/2016] LORazepam (ATIVAN) tablet 0-4 mg  0-4 mg Oral Q12H Charm RingsJamison Y Lord, NP      . magnesium hydroxide (MILK OF MAGNESIA) suspension 30 mL  30 mL Oral Daily PRN Charm RingsJamison Y Lord, NP      . nicotine (NICODERM CQ - dosed in mg/24 hours) patch 21 mg  21 mg Transdermal Daily Charm RingsJamison Y Lord, NP      . ondansetron [facility](ZOFRAN) tablet 4 mg  4 mg Oral Q8H PRN Charm RingsJamison Y Lord, NP      . pantoprazole (PROTONIX) EC tablet 40 mg  40 mg Oral Daily Charm RingsJamison Y Lord, NP   40 mg at 03/16/16 [id number]  . thiamine (VITAMIN B-1) tablet 100 mg  100 mg Oral Daily Charm RingsJamison Y Lord, NP   100 mg at 03/16/16 [id number]   Or  . thiamine (B-1) injection 100 mg  100 mg Intravenous Daily Charm RingsJamison Y Lord, NP      . traZODone (DESYREL) tablet 100 mg  100 mg Oral QHS,MR X 1 Kerry HoughSpencer E Simon, PA-C   100 mg at 03/15/16 2240  . Vilazodone HCl (VIIBRYD) TABS 40 mg  40  mg Oral Daily Charm RingsJamison Y Lord, NP   40 mg at 03/16/16 [id number]   PTA Medications: Prescriptions Prior to Admission  Medication Sig Dispense Refill Last Dose  . aspirin EC 81 MG tablet Take 81 mg by mouth daily.    Past Week at Unknown time  . pantoprazole (PROTONIX) 40 MG tablet Take 40 mg by mouth daily.  5 Past Week at Unknown time  . VIIBRYD 40 MG TABS Take 40 mg by mouth daily.  5 Past Week at Unknown time  . zolpidem (AMBIEN) 10 MG tablet Take 10 mg by mouth at bedtime as needed for sleep.   5 Past Week at Unknown time    Patient Stressors: Marital or family conflict Substance abuse  Patient Strengths: Capable of independent living Physical Health Supportive family/friends  Treatment Modalities: Medication Management, Group therapy, Case management,  1 to 1 session with clinician, Psychoeducation, Recreational therapy.   Physician Treatment Plan for Primary Diagnosis: MDD (major depressive disorder), recurrent severe, without psychosis (HCC) Long Term Goal(s):     Short Term Goals:    Medication Management: Evaluate patient's response, side effects, and tolerance of medication regimen.  Therapeutic Interventions: 1 to 1  sessions, Unit Group sessions and Medication administration.  Evaluation of Outcomes: Progressing  Physician Treatment Plan for Secondary Diagnosis: Principal Problem:   MDD (major depressive disorder), recurrent severe, without psychosis (HCC) Active Problems:   Alcohol use disorder, moderate, dependence (HCC)  Long Term Goal(s):     Short Term Goals:       Medication Management: Evaluate patient's response, side effects, and tolerance of medication regimen.  Therapeutic Interventions: 1 to 1 sessions, Unit Group sessions and Medication administration.  Evaluation of Outcomes: Progressing   RN Treatment Plan for Primary Diagnosis: MDD (major depressive disorder), recurrent severe, without psychosis (HCC) Long Term Goal(s): Knowledge of disease and  therapeutic regimen to maintain health will improve  Short Term Goals: Ability to remain free from injury will improve, Ability to disclose and discuss suicidal ideas and Ability to identify and develop effective coping behaviors will improve  Medication Management: RN will administer medications as ordered by provider, will assess and evaluate patient's response and provide education to patient for prescribed medication. RN will report any adverse and/or side effects to prescribing provider.  Therapeutic Interventions: 1 on 1 counseling sessions, Psychoeducation, Medication administration, Evaluate responses to treatment, Monitor vital signs and CBGs as ordered, Perform/monitor CIWA, COWS, AIMS and Fall Risk screenings as ordered, Perform wound care treatments as ordered.  Evaluation of Outcomes: Progressing   LCSW Treatment Plan for Primary Diagnosis: MDD (major depressive disorder), recurrent severe, without psychosis (HCC) Long Term Goal(s): Safe transition to appropriate next level of care at discharge, Engage patient in therapeutic group addressing interpersonal concerns.  Short Term Goals: Engage patient in aftercare planning with referrals and resources, Facilitate patient progression through stages of change regarding substance use diagnoses and concerns and Identify triggers associated with mental health/substance abuse issues  Therapeutic Interventions: Assess for all discharge needs, 1 to 1 time with Social worker, Explore available resources and support systems, Assess for adequacy in community support network, Educate family and significant other(s) on suicide prevention, Complete Psychosocial Assessment, Interpersonal group therapy.  Evaluation of Outcomes: Progressing   Progress in Treatment: Attending groups: No. New to unit. Continuing to assess.  Participating in groups: No. Taking medication as prescribed: Yes. Toleration medication: Yes. Family/Significant other contact  made: No, will contact:  family member if patient consents Patient understands diagnosis: Yes. Discussing patient identified problems/goals with staff: Yes. Medical problems stabilized or resolved: Yes. Denies suicidal/homicidal ideation: Yes. Issues/concerns per patient self-inventory: No. Other: n/a  New problem(s) identified: Yes, Describe:  patient reports guns in home and plans to give them to his son. CSW to follow-up with patient and his son about this prior to his discharge.  New Short Term/Long Term Goal(s):  Discharge Plan or Barriers: CSW assessing for appropriate referrals.   Reason for Continuation of Hospitalization: Depression Medication stabilization Withdrawal symptoms  Estimated Length of Stay: 3-5 days   Attendees: Patient: 03/16/2016 12:38 PM  Physician: Dr. Vanetta ShawlHisada MD 03/16/2016 12:38 PM  Nursing: Foy Guadalajarahrista RN; [facility] RN 03/16/2016 12:38 PM  RN Care Manager: 03/16/2016 12:38 PM  Social Worker: Herbert SetaHeather Smart, LCSW; Vernie ShanksLauren Newton LCSW 03/16/2016 12:38 PM  Recreational Therapist:  03/16/2016 12:38 PM  Other: Armandina StammerAgnes Nwoko NP; Claudette Headonrad Withrow NP 03/16/2016 12:38 PM  Other:  03/16/2016 12:38 PM  Other: 03/16/2016 12:38 PM    Scribe for Treatment Team: Ledell PeoplesHeather N Smart, LCSW 03/16/2016 12:38 PM

## 2016-03-16 NOTE — [facility] Group Notes (Signed)
[facility] LCSW Group Therapy  03/16/2016 4:12 PM  Type of Therapy:  Group Therapy  Participation Level:  Did Not Attend-pt invited. Chose to remain in bed. Pt reports drowsiness and some withdrawals this afternoon.   Summary of Progress/Problems: Today's Topic: Overcoming Obstacles. Patients identified one short term goal and potential obstacles in reaching this goal. Patients processed barriers involved in overcoming these obstacles. Patients identified steps necessary for overcoming these obstacles and explored motivation (internal and external) for facing these difficulties head on.   Drae Mitzel N Smart LCSW 03/16/2016, 4:12 PM

## 2016-03-16 NOTE — Progress Notes (Signed)
Patient presents with flat affect and confused/depressed behavior during admission interview and assessment. VS monitored and recorded. [facility] and revealed abrasions to bilateral forearms and superficial lacerations to lower back. Contraband was not found. Patient was oriented to unit and schedule. Pt states "I drink too much, and I need help getting it under control. I need to find better ways to handle myself when I get mad." Pt denies SI/HI/AVH at this time. PO fluids provided. Safety maintained. Rest encouraged.

## 2016-03-16 NOTE — [facility] Counselor (Signed)
Adult Comprehensive Assessment  Patient ID: Tyler PileClarence K Coleman, male   DOB: Jan 26, 1956, 60 y.o.   MRN: [id number]  Information Source: Information source: Patient  Current Stressors:  Educational / Learning stressors: 11th grade-quit and started working Employment / Job issues: retired "last year." Family Relationships: close to son who does not live with him; good relationship with his wife; strained with his son that is living at home.  Financial / Lack of resources (include bankruptcy): retirment; savings; [employer]urance Housing / Lack of housing: lives in home with wife and 62 yo son-can be stressful at times with his son in the home Physical health (include injuries & life threatening diseases): none identified; chronic pain issues from "wear and tear on body." Pt appears to be somewhat hard of hearing during assessment. Social relationships: close friends and family Substance abuse: pt reports drinking increased after retirement in the past year. "I don't drink every day but I drink more than I did a few years ago." no other drug use identified Bereavement / Loss: wife has 'the worse type of breast cancer there is."   Living/Environment/Situation:  Living Arrangements: Children, Spouse/significant other Living conditions (as described by patient or guardian): lives with wife and 62 yo son "he's a veteran and is hard to live with sometimes."  How long has patient lived in current situation?: 25 plus years What is atmosphere in current home: Chaotic, ParamedicLoving, Supportive  Family History:  Marital status: Married Number of Years Married: 25 What types of issues is patient dealing with in the relationship?: drinking has become an issue. pt reports that his wife's breast cancer diagnosis has been very stressful on him and the family Additional relationship information: n/a  Are you sexually active?: Yes What is your sexual orientation?: heterosexual Has your sexual activity been affected by  drugs, alcohol, medication, or emotional stress?: n/a  Does patient have children?: Yes How many children?: 2 How is patient's relationship with their children?: 2 sons. one lives in the home and the other does not. pt reports that he and his wife lost a child due to premature birth.   Childhood History:  By whom was/is the patient raised?: Both parents Additional childhood history information: "my dad made liquor and was an alcoholic." Pt reports that he was the 14th of 15 children. "My dad would take off for weeks at a time and leave my mom at home with all of us."  Description of patient's relationship with caregiver when they were a child: close to mother; strained from father Patient's description of current relationship with people who raised him/her: parents are deceased. relationship with his father remained strained How were you disciplined when you got in trouble as a child/adolescent?: n/a  Does patient have siblings?: Yes Number of Siblings: 14 Description of patient's current relationship with siblings: pt is 14 out of 15 children. "I try to stay out of their business. Our family went to hell after my mom died in [id number]. We are not close anymore."  Did patient suffer any verbal/emotional/physical/sexual abuse as a child?: No Did patient suffer from severe childhood neglect?: No Has patient ever been sexually abused/assaulted/raped as an adolescent or adult?: No Was the patient ever a victim of a crime or a disaster?: No Witnessed domestic violence?: No Has patient been effected by domestic violence as an adult?: No  Education:  Highest grade of school patient has completed: 11th grade. 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  Has patient ever been in the [employer] and Companymilitary?: No Has patient ever served in combat?: No Did You Receive Any Psychiatric Treatment/Services While in Equities traderthe Military?: No Are There Guns or Other Weapons in Your Home?: No Are These ComptrollerWeapons Safely Secured?: Yes (pt's guns have been taken by his son who does not live in the home and have been secured.)  Architectinancial Resources:   Surveyor, quantityinancial resources: [employer] insurance, Income from employment Does patient have a Lawyerrepresentative payee or guardian?: No  Alcohol/Substance Abuse:   What has been your use of drugs/alcohol within the last 12 months?: alcohol several times per week "but not everyday." pt reports increased beer consumption since "last year when I retired. I"ve been feeling more down lately." no substance abuse reported by pt.  If attempted suicide, did drugs/alcohol play a role in this?: No ("I apparently made a suicidal statement when I was drunk but I don't remember." ) Alcohol/Substance Abuse Treatment Hx: Denies past history If yes, describe treatment: n/a  Has alcohol/substance abuse ever caused legal problems?: Yes (pt reports DUI and court date in Jan 2018. )  Social Support System:   Patient's Community Support System: [employer] Museum/gallery exhibitions officerDescribe Community Support System: some close friends. other than immediate family, not much support from siblings Type of faith/religion: christian  How does patient's faith help to cope with current illness?: "I don't know."   Leisure/Recreation:   Leisure and Hobbies: "I used to like to hunt but not anymore."   Strengths/Needs:   What things does the patient do well?: providing for my family; handywork In what areas does patient struggle / problems for patient: depression; feeling "down." self medicating with alcohol   Discharge Plan:   Does patient have access to transportation?: Yes ("My wife has to drive me. I lost my license  because of that DUI." ) Will patient be returning to same living situation after discharge?: Yes Currently receiving community mental health services: Yes (From Whom) ([facility]) If no, would patient like referral for services when discharged?: Yes (What county?) [employer] [place] Does patient have financial barriers related to discharge medications?: No (private insurance)  Summary/Recommendations:   Emergency planning/management officerummary and Recommendations (to be completed by the evaluator): Patient is [age] year [place] his [place] and [place]. He presents to the hospital involuntarily due to alcohol intoxication and SI statements. Patient current denies SI/HI/AVH. He has a diagnosis of MDD and Alcohol Use Disorder. Patient reports stressors include: retirement; increased depressive symptoms, and his wife's health issues. He reports that his guns have been removed from the home by his son and are secured. Patient plans to return home and resume follow-up with the [facility]. CSW assessing. Recommendations for patient include: crisis stabilization, therapeutic milieu, encourage group attendance and participation, medication management for mood stabilization/detox, and development of comprehensive mental wellness/sobriety plan.   Ledell PeoplesHeather N Smart LCSW 03/16/2016 3:36 PM

## 2016-03-16 NOTE — Progress Notes (Signed)
Patient did not attend AA group meeting. 

## 2016-03-16 NOTE — Progress Notes (Signed)
Recreation Therapy Notes  Date: 03/16/16 Time: 0930 Location: [address] Dayroom  Group Topic: Stress Management  Goal Area(s) Addresses:  Patient will verbalize importance of using healthy stress management.  Patient will identify positive emotions associated with healthy stress management.   Intervention: Calm App  Activity :  Resilience Meditation.  LRT introduced the stress management technique of meditation.  LRT played the meditation from the calm app to engage patients in the activity.  Patients were to follow along as the meditation played to engage in the process.  Education:  Stress Management, Discharge Planning.   Education Outcome: Acknowledges edcuation/In group clarification offered/Needs additional education  Clinical Observations/Feedback: Pt did not attend  group.   Charlsey Moragne, LRT/CTRS         Tyler Coleman A 03/16/2016 12:27 PM 

## 2016-03-16 NOTE — Progress Notes (Signed)
D:  Patient denied SI and HI, contracts for safety.  Denied A/V hallucinations.   A:  Medications administered per MD orders.  Emotional support and encouragement given patient. R:  Safety maintained with 15 minute checks.  

## 2016-03-16 NOTE — Progress Notes (Signed)
Patient ID: Tyler PileClarence K Coleman, male   DOB: [birth year], 60 y.o.   MRN: [id number] D: Client visible on the unit, seen interacting with peers. Client reports diarrhea "had since I came in here, but I thought it was going to stop" A: Writer provided emotional support encouraged client to report concerns as soon as possible. Received order for Imodium, medications reviewed, administered as ordered. Staff will monitor q4015min for safety. R:Client is safe on the unit.

## 2016-03-16 NOTE — Plan of Care (Signed)
Problem: Education: Goal: Utilization of techniques to improve thought processes will improve Outcome: Progressing Nurse discussed depression/coping skills with patient.    

## 2016-03-16 NOTE — H&P (Signed)
Psychiatric Admission Assessment Adult  Patient Identification: Tyler Coleman MRN:  [id number] Date of Evaluation:  03/16/2016 Chief Complaint:  MDD Severe Principal Diagnosis: <principal problem not specified> Diagnosis:   Patient Active Problem List   Diagnosis Date Noted  . Major depressive disorder, recurrent severe without psychotic features ([place]) [F33.2] 03/15/2016  . MDD (major depressive disorder), recurrent severe, without psychosis ([place]) [F33.2] 03/15/2016   History of Present Illness: I have reviewed and concur with HPI elements below, modified as follows:  "Tyler Coleman is an 60 y.o. male brought by [facility] department. Patient presented to [facility] with IVC. Writer met with patient face to face. Sts that he is unaware of why he was brought to [facility]. Sts, "The last thing I remember doing is drinking". Patient explains that he is alcoholic. He reports 10 yrs of sobriety.  Patient relapsed 3x's starting the early part of December. He reports increased depression about his relapsed. He reports feelings of worthlessness, hopelessness, loss of interest in usual pleasures, and fatigue. He admits that he has felt depressed. He doesn't know if he had intentions of trying to harm himself yesterday. Per ED notes, "Patient extremely intoxicated, called 911 and threatened to shoot self and any police if they showed up. Guns and covered windows found in home like he wanted to have a standoff with police. Pt has been repeatedly requesting police to shoot him." Patient admits that he owns several guns. He admits that he doesn't trust himself and plans to give his guns to his son to keep. He denies HI. No history of harm to others. He is facing legal charges for a DUI. He has a related court date 04/01/2016. No AVH's. Patient unsure if he recalls previous INPT mental health substance abuse treatment.  No outpatient psychiatrist or therapist reported."  Today on 03/16/16, pt seen and chart reviewed  for H&P: Pt is alert/oriented x4, calm, cooperative, and appropriate to situation. Pt denies suicidal/homicidal ideation and psychosis and does not appear to be responding to internal stimuli. Pt reports that he was very intoxicated and does not recall stating anything about harming himself. Initially he said "they were drinking, not me" but then reported that he was drinking also. Pt reports that he recently relapsed and would like help with his depression.   Associated Signs/Symptoms: Depression Symptoms:  depressed mood, anhedonia, insomnia, psychomotor agitation, psychomotor retardation, feelings of worthlessness/guilt, difficulty concentrating, hopelessness, recurrent thoughts of death, (Hypo) Manic Symptoms:  Impulsivity, Anxiety Symptoms:  Excessive Worry, Psychotic Symptoms:  denies PTSD Symptoms: denies Total Time spent with patient: 45 minutes  Past Psychiatric History: ETOH, MDD, GAD, insomnia  Is the patient at risk to self? Yes.    Has the patient been a risk to self in the past 6 months? Yes.    Has the patient been a risk to self within the distant past? Yes.    Is the patient a risk to others? No.  Has the patient been a risk to others in the past 6 months? No.  Has the patient been a risk to others within the distant past? No.   Prior Inpatient Therapy:   Prior Outpatient Therapy:    Alcohol Screening: Patient refused Alcohol Screening Tool: Yes 1. How often do you have a drink containing alcohol?: 2 to 3 times a week 2. How many drinks containing alcohol do you have on a typical day when you are drinking?: 7, 8, or 9 3. How often do you have six or more drinks  on one occasion?: Weekly Preliminary Score: 6 4. How often during the last year have you found that you were not able to stop drinking once you had started?: Monthly 5. How often during the last year have you failed to do what was normally expected from you becasue of drinking?: Monthly 6. How often  during the last year have you needed a first drink in the morning to get yourself going after a heavy drinking session?: Monthly 7. How often during the last year have you had a feeling of guilt of remorse after drinking?: Monthly 8. How often during the last year have you been unable to remember what happened the night before because you had been drinking?: Monthly 9. Have you or someone else been injured as a result of your drinking?: No 10. Has a relative or friend or a doctor or another health worker been concerned about your drinking or suggested you cut down?: Yes, during the last year Alcohol Use Disorder Identification Test Final Score (AUDIT): 23 Brief Intervention: Yes Substance Abuse History in the last 12 months:  Yes.   Consequences of Substance Abuse: mood destabilization Previous Psychotropic Medications: Yes  Psychological Evaluations: Yes  Past Medical History:  Past Medical History:  Diagnosis Date  . Hx of mixed drug abuse     Past Surgical History:  Procedure Laterality Date  . BACK SURGERY     Family History:  Family History  Problem Relation Age of Onset  . Heart failure Mother   . Heart failure Father    Family Psychiatric  History: depression Tobacco Screening:   Social History:  History  Alcohol Use  . 1.8 oz/week  . 3 Cans of beer per week     History  Drug Use  . Types: Cocaine    Additional Social History:                           Allergies:  No Known Allergies Lab Results:  Results for orders placed or performed during the hospital encounter of 03/14/16 (from the past 48 hour(s))  Rapid urine drug screen (hospital performed)     Status: None   Collection Time: 03/14/16 10:55 PM  Result Value Ref Range   Opiates NONE DETECTED NONE DETECTED   Cocaine NONE DETECTED NONE DETECTED   Benzodiazepines NONE DETECTED NONE DETECTED   Amphetamines NONE DETECTED NONE DETECTED   Tetrahydrocannabinol NONE DETECTED NONE DETECTED    Barbiturates NONE DETECTED NONE DETECTED    Comment:        DRUG SCREEN FOR MEDICAL PURPOSES ONLY.  IF CONFIRMATION IS NEEDED FOR ANY PURPOSE, NOTIFY LAB WITHIN 5 DAYS.        LOWEST DETECTABLE LIMITS FOR URINE DRUG SCREEN Drug Class       Cutoff (ng/mL) Amphetamine      1000 Barbiturate      200 Benzodiazepine   989 Tricyclics       211 Opiates          300 Cocaine          300 THC              50   Comprehensive metabolic panel     Status: Abnormal   Collection Time: 03/14/16 11:03 PM  Result Value Ref Range   Sodium 139 135 - 145 mmol/L   Potassium 3.3 (L) 3.5 - 5.1 mmol/L   Chloride 102 101 - 111 mmol/L   CO2 25 22 -  32 mmol/L   Glucose, Bld 104 (H) 65 - 99 mg/dL   BUN 9 6 - 20 mg/dL   Creatinine, Ser 0.81 0.61 - 1.24 mg/dL   Calcium 8.8 (L) 8.9 - 10.3 mg/dL   Total Protein 7.0 6.5 - 8.1 g/dL   Albumin 4.7 3.5 - 5.0 g/dL   AST 25 15 - 41 U/L   ALT 26 17 - 63 U/L   Alkaline Phosphatase 68 38 - 126 U/L   Total Bilirubin 0.5 0.3 - 1.2 mg/dL   GFR calc non Af Amer >60 >60 mL/min   GFR calc Af Amer >60 >60 mL/min    Comment: (NOTE) The eGFR has been calculated using the CKD EPI equation. This calculation has not been validated in all clinical situations. eGFR's persistently <60 mL/min signify possible Chronic Kidney Disease.    Anion gap 12 5 - 15  Ethanol     Status: Abnormal   Collection Time: 03/14/16 11:03 PM  Result Value Ref Range   Alcohol, Ethyl (B) 211 (H) <5 mg/dL    Comment:        LOWEST DETECTABLE LIMIT FOR SERUM ALCOHOL IS 5 mg/dL FOR MEDICAL PURPOSES ONLY   Salicylate level     Status: None   Collection Time: 03/14/16 11:03 PM  Result Value Ref Range   Salicylate Lvl <9.2 2.8 - 30.0 mg/dL  Acetaminophen level     Status: Abnormal   Collection Time: 03/14/16 11:03 PM  Result Value Ref Range   Acetaminophen (Tylenol), Serum <10 (L) 10 - 30 ug/mL    Comment:        THERAPEUTIC CONCENTRATIONS VARY SIGNIFICANTLY. A RANGE OF 10-30 ug/mL MAY BE  AN EFFECTIVE CONCENTRATION FOR MANY PATIENTS. HOWEVER, SOME ARE BEST TREATED AT CONCENTRATIONS OUTSIDE THIS RANGE. ACETAMINOPHEN CONCENTRATIONS >150 ug/mL AT 4 HOURS AFTER INGESTION AND >50 ug/mL AT 12 HOURS AFTER INGESTION ARE OFTEN ASSOCIATED WITH TOXIC REACTIONS.   cbc     Status: None   Collection Time: 03/14/16 11:03 PM  Result Value Ref Range   WBC 6.1 4.0 - 10.5 K/uL   RBC 4.64 4.22 - 5.81 MIL/uL   Hemoglobin 13.9 13.0 - 17.0 g/dL   HCT 39.0 39.0 - 52.0 %   MCV 84.1 78.0 - 100.0 fL   MCH 30.0 26.0 - 34.0 pg   MCHC 35.6 30.0 - 36.0 g/dL   RDW 12.6 11.5 - 15.5 %   Platelets 196 150 - 400 K/uL  Ethanol     Status: Abnormal   Collection Time: 03/15/16  1:33 AM  Result Value Ref Range   Alcohol, Ethyl (B) 157 (H) <5 mg/dL    Comment:        LOWEST DETECTABLE LIMIT FOR SERUM ALCOHOL IS 5 mg/dL FOR MEDICAL PURPOSES ONLY     Blood Alcohol level:  Lab Results  Component Value Date   ETH 157 (H) 03/15/2016   ETH 211 (H) 33/00/7622    Metabolic Disorder Labs:  No results found for: HGBA1C, MPG No results found for: PROLACTIN No results found for: CHOL, TRIG, HDL, CHOLHDL, VLDL, LDLCALC  Current Medications: Current Facility-Administered Medications  Medication Dose Route Frequency Provider Last Rate Last Dose  . acetaminophen (TYLENOL) tablet 650 mg  650 mg Oral Q4H PRN Patrecia Pour, NP   650 mg at 03/15/16 2122  . alum & mag hydroxide-simeth (MAALOX/MYLANTA) 200-200-20 MG/5ML suspension 30 mL  30 mL Oral PRN Patrecia Pour, NP      . aspirin EC tablet  81 mg  81 mg Oral Daily Patrecia Pour, NP   81 mg at 03/16/16 0919  . ibuprofen (ADVIL,MOTRIN) tablet 600 mg  600 mg Oral Q8H PRN Patrecia Pour, NP   600 mg at 03/16/16 9767  . LORazepam (ATIVAN) tablet 0-4 mg  0-4 mg Oral Q6H Patrecia Pour, NP   Stopped at 03/15/16 2255   Followed by  . [START ON 03/17/2016] LORazepam (ATIVAN) tablet 0-4 mg  0-4 mg Oral Q12H Patrecia Pour, NP      . magnesium hydroxide (MILK  OF MAGNESIA) suspension 30 mL  30 mL Oral Daily PRN Patrecia Pour, NP      . nicotine (NICODERM CQ - dosed in mg/24 hours) patch 21 mg  21 mg Transdermal Daily Patrecia Pour, NP      . ondansetron [facility]) tablet 4 mg  4 mg Oral Q8H PRN Patrecia Pour, NP      . pantoprazole (PROTONIX) EC tablet 40 mg  40 mg Oral Daily Patrecia Pour, NP   40 mg at 03/16/16 3419  . thiamine (VITAMIN B-1) tablet 100 mg  100 mg Oral Daily Patrecia Pour, NP   100 mg at 03/16/16 3790   Or  . thiamine (B-1) injection 100 mg  100 mg Intravenous Daily Patrecia Pour, NP      . traZODone (DESYREL) tablet 100 mg  100 mg Oral QHS,MR X 1 Laverle Hobby, PA-C   100 mg at 03/15/16 2240  . Vilazodone HCl (VIIBRYD) TABS 40 mg  40 mg Oral Daily Patrecia Pour, NP   40 mg at 03/16/16 2409   PTA Medications: Prescriptions Prior to Admission  Medication Sig Dispense Refill Last Dose  . aspirin EC 81 MG tablet Take 81 mg by mouth daily.    Past Week at Unknown time  . pantoprazole (PROTONIX) 40 MG tablet Take 40 mg by mouth daily.  5 Past Week at Unknown time  . VIIBRYD 40 MG TABS Take 40 mg by mouth daily.  5 Past Week at Unknown time  . zolpidem (AMBIEN) 10 MG tablet Take 10 mg by mouth at bedtime as needed for sleep.   5 Past Week at Unknown time    Musculoskeletal: Strength & Muscle Tone: within normal limits Gait & Station: normal Patient leans: N/A  Psychiatric Specialty Exam: Physical Exam  Review of Systems  Psychiatric/Behavioral: Positive for depression and substance abuse. Negative for hallucinations and suicidal ideas. The patient is nervous/anxious and has insomnia.   All other systems reviewed and are negative.   Blood pressure (!) 175/97, pulse 86, temperature 98.3 F (36.8 C), temperature source Oral, resp. rate 16, height '5\' 9"'  (1.753 m), weight 97.5 kg (215 lb), SpO2 100 %.Body mass index is 31.75 kg/m.  General Appearance: Casual, Fairly Groomed and Guarded  Eye Contact:  Fair  Speech:  Clear  and Coherent and Normal Rate  Volume:  Normal  Mood:  Anxious and Depressed  Affect:  Appropriate, Congruent and Depressed  Thought Process:  Coherent, Goal Directed, Linear and Descriptions of Associations: Loose  Orientation:  Full (Time, Place, and Person)  Thought Content:  Focused on drug abuse, family dynamic strain at home  Suicidal Thoughts:  No  Homicidal Thoughts:  No  Memory:  Immediate;   Fair Recent;   Fair Remote;   Fair  Judgement:  Fair  Insight:  Fair  Psychomotor Activity:  Normal  Concentration:  Concentration: Fair and Attention Span: Fair  Recall:  Tyler Coleman of Knowledge:  Fair  Language:  Fair  Akathisia:  No  Handed:    AIMS (if indicated):     Assets:  Communication Skills Desire for Improvement Resilience Social Support  ADL's:  Intact  Cognition:  WNL  Sleep:  Number of Hours: 0.75   Treatment Plan Summary: MDD (major depressive disorder), recurrent severe, without psychosis ([place]) unstable, warrants inpatient, managed as below:  Medications:  -Ativan/CIWA detox protocol -nicotine patch -protonix 42m po daily for chronic GERD -Trazodone 1057mpo qhs prn insomnia -Start Wellbutrin 15041mL daily for depression (and loss of motivation) -Discontinue Viibryd (was home med, reports not working at all long-term)  Observation Level/Precautions:  15 minute checks  Laboratory:  Labs resulted, reviewed, and stable at this time.   Psychotherapy:  Group therapy, individual therapy, psychoeducation  Medications:  See MAR above  Consultations: None    Discharge Concerns: None    Estimated LOS: 5-7 days  Other:  N/A     Physician Treatment Plan for Primary Diagnosis: MDD (major depressive disorder), recurrent severe, without psychosis ([place] Term Goal(s): Improvement in symptoms so as ready for discharge  Short Term Goals: Ability to identify changes in lifestyle to reduce recurrence of condition will improve, Ability to verbalize feelings will  improve, Ability to disclose and discuss suicidal ideas, Ability to demonstrate self-control will improve, Ability to identify and develop effective coping behaviors will improve, Ability to maintain clinical measurements within normal limits will improve, Compliance with prescribed medications will improve and Ability to identify triggers associated with substance abuse/mental health issues will improve  Physician Treatment Plan for Secondary Diagnosis: Active Problems:   MDD (major depressive disorder), recurrent severe, without psychosis ([place] Term Goal(s): Improvement in symptoms so as ready for discharge  Short Term Goals: Ability to identify changes in lifestyle to reduce recurrence of condition will improve, Ability to verbalize feelings will improve, Ability to disclose and discuss suicidal ideas, Ability to demonstrate self-control will improve, Ability to identify and develop effective coping behaviors will improve, Ability to maintain clinical measurements within normal limits will improve, Compliance with prescribed medications will improve and Ability to identify triggers associated with substance abuse/mental health issues will improve  I certify that inpatient services furnished can reasonably be expected to improve the patient's condition.    WitBenjamine MolaNPNorth West [place]/27/20179:46 AM

## 2016-03-16 NOTE — Progress Notes (Signed)
Nursing Progress Note: 7p-7a D: Pt currently presents with a confused/flat/anxious/depressed affect and behavior. Pt reports to writer that their goal is to "get help with drug use." Pt states "i'm tired of people telling me what to do. I just want to rest and get back on my feet." Interacting minimally with milieu. Pt reports off and on sleep with current medication regimen.   A: Pt provided with medications per providers orders. Pt's labs and vitals were monitored throughout the night. Pt supported emotionally and encouraged to express concerns and questions. Pt educated on medications.  R: Pt's safety ensured with 15 minute and environmental checks. Pt currently denies SI/HI/Self Harm and A/V hallucinations. Pt verbally contracts to seek staff if SI/HI or A/VH occurs and to consult with staff before acting on any harmful thoughts. Will continue to monitor.

## 2016-03-16 NOTE — [facility] Suicide Risk Assessment (Signed)
[facility]   Nursing information obtained from:    Demographic factors:    Current Mental Status:    Loss Factors:    Historical Factors:    Risk Reduction Factors:     Total Time spent with patient: 30 minutes Principal Problem: MDD (major depressive disorder), recurrent severe, without psychosis (HCC) Diagnosis:   Patient Active Problem List   Diagnosis Date Noted  . Alcohol use disorder, moderate, dependence (HCC) [F10.20] 03/16/2016  . MDD (major depressive disorder), recurrent severe, without psychosis (HCC) [F33.2] 03/15/2016   Subjective Data: Patient states worsening sadness as well as anxiety sx . Pt also with alcohol abuse issues - reports use atleast 2-3 times a week. Pt is interested in starting an Antidepressant. Continued Clinical Symptoms:  Alcohol Use Disorder Identification Test Final Score (AUDIT): 23 The "Alcohol Use Disorders Identification Test", Guidelines for Use in Primary Care, Second Edition.  World Science writerHealth Organization [facility](WHO). Score between 0-7:  no or low risk or alcohol related problems. Score between 8-15:  moderate risk of alcohol related problems. Score between 16-19:  high risk of alcohol related problems. Score 20 or above:  warrants further diagnostic evaluation for alcohol dependence and treatment.   CLINICAL FACTORS:   Alcohol/Substance Abuse/Dependencies Previous Psychiatric Diagnoses and Treatments   Musculoskeletal: Strength & Muscle Tone: within normal limits Gait & Station: normal Patient leans: N/A  Psychiatric Specialty Exam: Physical Exam  Review of Systems  Psychiatric/Behavioral: Positive for depression and substance abuse. The patient is nervous/anxious and has insomnia.   All other systems reviewed and are negative.   Blood pressure (!) 175/97, pulse 86, temperature 98.3 F (36.8 C), temperature source Oral, resp. rate 16, height 5\' 9"  (1.753 m), weight 97.5 kg (215 lb), SpO2 100 %.Body mass index  is 31.75 kg/m.  General Appearance: Guarded  Eye Contact:  Fair  Speech:  Normal Rate  Volume:  Normal  Mood:  Anxious and Depressed  Affect:  Congruent  Thought Process:  Goal Directed and Descriptions of Associations: Circumstantial  Orientation:  Full (Time, Place, and Person)  Thought Content:  Logical and Rumination  Suicidal Thoughts:  No  Homicidal Thoughts:  No  Memory:  Immediate;   Fair Recent;   Fair Remote;   Fair  Judgement:  Fair  Insight:  Fair  Psychomotor Activity:  Normal  Concentration:  Concentration: Fair and Attention Span: Fair  Recall:  [employer]Fair  Fund of Knowledge:  Fair  Language:  Fair  Akathisia:  No  Handed:  Right  AIMS (if indicated):     Assets:  Communication Skills Desire for Improvement  ADL's:  Intact  Cognition:  WNL  Sleep:  Number of Hours: 0.75      COGNITIVE FEATURES THAT CONTRIBUTE TO RISK:  Closed-mindedness, Polarized thinking and Thought constriction (tunnel vision)    SUICIDE RISK:   Mild:  Suicidal ideation of limited frequency, intensity, duration, and specificity.  There are no identifiable plans, no associated intent, mild dysphoria and related symptoms, good self-control (both objective and subjective assessment), few other risk factors, and identifiable protective factors, including available and accessible social support.   PLAN OF CARE: Patient with depression and alcohol abuse - case discussed with Beau FannyJohn C Withrow NP - please also see H&P. Continue CIWA protocol. Start an antidepressant to address his depressive sx. Continue treatment.   I certify that inpatient services furnished can reasonably be expected to improve the patient's condition.  Arieona Swaggerty, MD 03/16/2016, 11:52 AM

## 2016-03-17 DIAGNOSIS — Z87891 Personal history of nicotine dependence: Secondary | ICD-10-CM

## 2016-03-17 LAB — COMPREHENSIVE METABOLIC PANEL
ALBUMIN: 4.4 g/dL (ref 3.5–5.0)
ALT: 24 U/L (ref 17–63)
ANION GAP: 11 (ref 5–15)
AST: 35 U/L (ref 15–41)
Alkaline Phosphatase: 63 U/L (ref 38–126)
BUN: 14 mg/dL (ref 6–20)
CHLORIDE: 108 mmol/L (ref 101–111)
CO2: 24 mmol/L (ref 22–32)
Calcium: 9.1 mg/dL (ref 8.9–10.3)
Creatinine, Ser: 0.84 mg/dL (ref 0.61–1.24)
GFR calc Af Amer: >60 mL/min (ref >60)
GFR calc non Af Amer: >60 mL/min (ref >60)
GLUCOSE: 90 mg/dL (ref 65–99)
POTASSIUM: 3.2 mmol/L — AB (ref 3.5–5.1)
SODIUM: 143 mmol/L (ref 135–145)
Total Bilirubin: 1.1 mg/dL (ref 0.3–1.2)
Total Protein: 6.7 g/dL (ref 6.5–8.1)

## 2016-03-17 MED ORDER — DOXEPIN HCL 25 MG PO CAPS
25.0000 mg | ORAL_CAPSULE | Freq: Every evening | ORAL | Status: DC | PRN
  Administered 2016-03-17 (×2): 25 mg via ORAL
  Filled 2016-03-17 (×4): qty 1

## 2016-03-17 MED ORDER — LORATADINE 10 MG PO TABS
10.0000 mg | ORAL_TABLET | Freq: Every day | ORAL | Status: DC
  Administered 2016-03-17 – 2016-03-21 (×5): 10 mg via ORAL
  Filled 2016-03-17 (×8): qty 1

## 2016-03-17 MED ORDER — POTASSIUM CHLORIDE CRYS ER 20 MEQ PO TBCR
40.0000 meq | EXTENDED_RELEASE_TABLET | Freq: Once | ORAL | Status: AC
  Administered 2016-03-17: 40 meq via ORAL
  Filled 2016-03-17 (×2): qty 2

## 2016-03-17 NOTE — Progress Notes (Signed)
Patient did not attend group meeting.  

## 2016-03-17 NOTE — [facility] Suicide Risk Assessment (Signed)
[facility] INPATIENT:  Family/Significant Other Suicide Prevention Education  Suicide Prevention Education:  Education Completed; Tyler Coleman (pt's wife) [phone] has been identified by the patient as the family member/significant other with whom the patient will be residing, and identified as the person(s) who will aid the patient in the event of a mental health crisis (suicidal ideations/suicide attempt).  With written consent from the patient, the family member/significant other has been provided the following suicide prevention education, prior to the and/or following the discharge of the patient.  The suicide prevention education provided includes the following:  Suicide risk factors  Suicide prevention and interventions  National Suicide Hotline telephone number  [facility] assessment telephone number  [facility] Emergency Assistance 911  [facility] and/or Residential Mobile Crisis Unit telephone number  Request made of family/significant other to:  Remove weapons (e.g., guns, rifles, knives), all items previously/currently identified as safety concern.    Remove drugs/medications (over-the-counter, prescriptions, illicit drugs), all items previously/currently identified as a safety concern.  The family member/significant other verbalizes understanding of the suicide prevention education information provided.  The family member/significant other agrees to remove the items of safety concern listed above.  Pt's wife wants pt to go to further treatment, although pt is refusing to agree to this. CSW confirmed that all guns have been removed from home and pt will not have access to them at this point.   Trula SladeHeather Smart, MSW, LCSW Clinical Social Worker 03/17/2016 11:34 AM

## 2016-03-17 NOTE — Progress Notes (Signed)
Patient ID: Maryellen PileClarence K Poplin, male   DOB: [birth year], 60 y.o.   MRN: [id number] D: Client complains tonight about roommate and his wife. "he acting weird, went to the BR and didn't flush the stool" "I just stay out of his way" "my wife made me mad, she says I need more, but she needs to be a part of the solution" "she won't take a part in nothing and she needs to cause she is part of the problem" "she won't go to [facility] of nothing" client reports in his twenties he went to a facility for 30 days. "I'm not ready to go that far this time." A: Clinical research associatewriter provided emotional support, encouraged client to focus on recovery and other may agree to come along as he progresses. Medications reviewed, administered as ordered. Staff will monitor q5815min for safety. R: Client is safe on the unit.

## 2016-03-17 NOTE — [facility] Group Notes (Signed)
[facility] LCSW Group Therapy  03/17/2016 2:01 PM  Type of Therapy:  Group Therapy  Participation Level:  Did Not Attend-pt invited. Chose to remain in bed.   Summary of Progress/Problems: [facility] Speaker came to talk about his personal journey with substance abuse and addiction. The pt processed ways by which to relate to the speaker. MHA speaker provided handouts and educational information pertaining to groups and services offered by the [facility].   Jeralyn Nolden N Smart LCSW 03/17/2016, 2:01 PM

## 2016-03-17 NOTE — Progress Notes (Signed)
[facility] MD Progress Note  03/17/2016 11:03 AM Tyler Coleman  MRN:  [id number] Subjective:  "I feel better today overall. I'm not sure if I want to do rehab or not."  Objective: Pt seen and chart reviewed. Pt is alert/oriented x4, calm, cooperative, and appropriate to situation. Pt denies suicidal/homicidal ideation and psychosis and does not appear to be responding to internal stimuli. Pt is minimizing the severity of his reason for coming into the hospital and states he is not sure if he wants any help after leaving here.   Principal Problem: MDD (major depressive disorder), recurrent severe, without psychosis ([place]) Diagnosis:   Patient Active Problem List   Diagnosis Date Noted  . Alcohol use disorder, moderate, dependence ([place]) [F10.20] 03/16/2016  . MDD (major depressive disorder), recurrent severe, without psychosis ([place]) [F33.2] 03/15/2016   Total Time spent with patient: 15 minutes  Past Psychiatric History: see H&P  Past Medical History:  Past Medical History:  Diagnosis Date  . Hx of mixed drug abuse     Past Surgical History:  Procedure Laterality Date  . BACK SURGERY     Family History:  Family History  Problem Relation Age of Onset  . Heart failure Mother   . Heart failure Father   . Mental illness Neg Hx    Family Psychiatric  History: see H&P Social History:  History  Alcohol Use  . 1.8 oz/week  . 3 Cans of beer per week     History  Drug Use  . Types: Cocaine    Social History   Social History  . Marital status: Single    Spouse name: N/A  . Number of children: N/A  . Years of education: N/A   Social History Main Topics  . Smoking status: Light Tobacco Smoker    Packs/day: 0.25    Types: Cigarettes  . Smokeless tobacco: Never Used  . Alcohol use 1.8 oz/week    3 Cans of beer per week  . Drug use:     Types: Cocaine  . Sexual activity: Not Asked   Other Topics Concern  . None   Social History Narrative  . None   Additional Social  History:                         Sleep: Fair  Appetite:  Good  Current Medications: Current Facility-Administered Medications  Medication Dose Route Frequency Provider Last Rate Last Dose  . acetaminophen (TYLENOL) tablet 650 mg  650 mg Oral Q4H PRN Patrecia Pour, NP   650 mg at 03/16/16 1813  . alum & mag hydroxide-simeth (MAALOX/MYLANTA) 200-200-20 MG/5ML suspension 30 mL  30 mL Oral PRN Patrecia Pour, NP      . aspirin EC tablet 81 mg  81 mg Oral Daily Patrecia Pour, NP   81 mg at 03/17/16 0858  . buPROPion (WELLBUTRIN XL) 24 hr tablet 150 mg  150 mg Oral Daily Benjamine Mola, FNP   150 mg at 03/17/16 0859  . ibuprofen (ADVIL,MOTRIN) tablet 600 mg  600 mg Oral Q8H PRN Patrecia Pour, NP   600 mg at 03/16/16 9373  . loperamide (IMODIUM) capsule 4 mg  4 mg Oral PRN Laverle Hobby, PA-C   4 mg at 03/16/16 2141  . LORazepam (ATIVAN) tablet 0-4 mg  0-4 mg Oral Q6H Patrecia Pour, NP   1 mg at 03/17/16 4287   Followed by  . LORazepam (ATIVAN) tablet 0-4  mg  0-4 mg Oral Q12H Patrecia Pour, NP      . magnesium hydroxide (MILK OF MAGNESIA) suspension 30 mL  30 mL Oral Daily PRN Patrecia Pour, NP      . nicotine (NICODERM CQ - dosed in mg/24 hours) patch 21 mg  21 mg Transdermal Daily Patrecia Pour, NP   21 mg at 03/17/16 0857  . ondansetron (ZOFRAN) tablet 4 mg  4 mg Oral Q8H PRN Patrecia Pour, NP      . pantoprazole (PROTONIX) EC tablet 40 mg  40 mg Oral Daily Patrecia Pour, NP   40 mg at 03/17/16 0859  . thiamine (VITAMIN B-1) tablet 100 mg  100 mg Oral Daily Patrecia Pour, NP   100 mg at 03/17/16 1308   Or  . thiamine (B-1) injection 100 mg  100 mg Intravenous Daily Patrecia Pour, NP      . traZODone (DESYREL) tablet 100 mg  100 mg Oral QHS,MR X 1 Laverle Hobby, PA-C   100 mg at 03/16/16 2337    Lab Results:  Results for orders placed or performed during the hospital encounter of 03/15/16 (from the past 48 hour(s))  Comprehensive metabolic panel     Status: Abnormal    Collection Time: 03/17/16  6:17 AM  Result Value Ref Range   Sodium 143 135 - 145 mmol/L   Potassium 3.2 (L) 3.5 - 5.1 mmol/L   Chloride 108 101 - 111 mmol/L   CO2 24 22 - 32 mmol/L   Glucose, Bld 90 65 - 99 mg/dL   BUN 14 6 - 20 mg/dL   Creatinine, Ser 0.84 0.61 - 1.24 mg/dL   Calcium 9.1 8.9 - 10.3 mg/dL   Total Protein 6.7 6.5 - 8.1 g/dL   Albumin 4.4 3.5 - 5.0 g/dL   AST 35 15 - 41 U/L   ALT 24 17 - 63 U/L   Alkaline Phosphatase 63 38 - 126 U/L   Total Bilirubin 1.1 0.3 - 1.2 mg/dL   GFR calc non Af Amer >60 >60 mL/min   GFR calc Af Amer >60 >60 mL/min    Comment: (NOTE) The eGFR has been calculated using the CKD EPI equation. This calculation has not been validated in all clinical situations. eGFR's persistently <60 mL/min signify possible Chronic Kidney Disease.    Anion gap 11 5 - 15    Comment: Performed at [facility]    Blood Alcohol level:  Lab Results  Component Value Date   ETH 157 (H) 03/15/2016   ETH 211 (H) 65/78/4696    Metabolic Disorder Labs: No results found for: HGBA1C, MPG No results found for: PROLACTIN No results found for: CHOL, TRIG, HDL, CHOLHDL, VLDL, LDLCALC  Physical Findings: AIMS: Facial and Oral Movements Muscles of Facial Expression: None, normal Lips and Perioral Area: None, normal Jaw: None, normal Tongue: None, normal,Extremity Movements Upper (arms, wrists, hands, fingers): None, normal Lower (legs, knees, ankles, toes): None, normal, Trunk Movements Neck, shoulders, hips: None, normal, Overall Severity Severity of abnormal movements (highest score from questions above): None, normal Incapacitation due to abnormal movements: None, normal Patient's awareness of abnormal movements (rate only patient's report): No Awareness, Dental Status Current problems with teeth and/or dentures?: No Does patient usually wear dentures?: No  CIWA:  CIWA-Ar Total: 2 COWS:  COWS Total Score: 2  Musculoskeletal: Strength  & Muscle Tone: within normal limits Gait & Station: normal Patient leans: N/A  Psychiatric Specialty Exam:  Physical Exam  Review of Systems  Psychiatric/Behavioral: Positive for depression and substance abuse. Negative for hallucinations and suicidal ideas. The patient is nervous/anxious and has insomnia.     Blood pressure (!) 146/84, pulse 74, temperature 97.8 F (36.6 C), temperature source Oral, resp. rate 20, height _0  (1.753 m), weight 97.5 kg (215 lb), SpO2 100 %.Body mass index is 31.75 kg/m.  General Appearance: Casual and Fairly Groomed  Eye Contact:  Good  Speech:  Clear and Coherent and Normal Rate  Volume:  Normal  Mood:  Anxious and Depressed  Affect:  Appropriate, Congruent and Depressed  Thought Process:  Coherent, Goal Directed, Linear and Descriptions of Associations: Intact  Orientation:  Full (Time, Place, and Person)  Thought Content:  Focused on discharge plans, poor sleep  Suicidal Thoughts:  No  Homicidal Thoughts:  No  Memory:  Immediate;   Fair Recent;   Fair Remote;   Fair  Judgement:  Fair  Insight:  Fair  Psychomotor Activity:  Normal  Concentration:  Concentration: Fair and Attention Span: Fair  Recall:  [employer] of Knowledge:  Fair  Language:  Fair  Akathisia:  No  Handed:    AIMS (if indicated):     Assets:  Communication Skills Desire for Improvement Resilience Social Support Talents/Skills  ADL's:  Intact  Cognition:  WNL  Sleep:  Number of Hours: 4.5   Treatment Plan Summary: MDD (major depressive disorder), recurrent severe, without psychosis ([place]) unstable, warrants inpatient, managed as below:  Today, medications reviewed on 03/17/16 and will make changes as follows:   Medications:  -Ativan/CIWA detox protocol -nicotine patch -protonix 42m po daily for chronic GERD -discontinue Trazodone 1052mpo qhs prn insomnia -start doxepin 2581mo qhs prn rpt x1 prn insomnia -continue Wellbutrin 150m73m daily for depression  (and loss of motivation) -start claritin for sneezing  WithBenjamine MolaP 03/17/2016, 11:03 AM

## 2016-03-18 LAB — COMPREHENSIVE METABOLIC PANEL
ALBUMIN: 4.6 g/dL (ref 3.5–5.0)
ALT: 28 U/L (ref 17–63)
AST: 33 U/L (ref 15–41)
Alkaline Phosphatase: 62 U/L (ref 38–126)
Anion gap: 8 (ref 5–15)
BILIRUBIN TOTAL: 1.3 mg/dL — AB (ref 0.3–1.2)
BUN: 14 mg/dL (ref 6–20)
CHLORIDE: 106 mmol/L (ref 101–111)
CO2: 26 mmol/L (ref 22–32)
CREATININE: 0.91 mg/dL (ref 0.61–1.24)
Calcium: 9.1 mg/dL (ref 8.9–10.3)
GFR calc Af Amer: >60 mL/min (ref >60)
GLUCOSE: 92 mg/dL (ref 65–99)
Potassium: 3.4 mmol/L — ABNORMAL LOW (ref 3.5–5.1)
Sodium: 140 mmol/L (ref 135–145)
TOTAL PROTEIN: 6.4 g/dL — AB (ref 6.5–8.1)

## 2016-03-18 MED ORDER — INFLUENZA VAC SPLIT QUAD 0.5 ML IM SUSY
0.5000 mL | PREFILLED_SYRINGE | INTRAMUSCULAR | Status: AC
  Administered 2016-03-20: 0.5 mL via INTRAMUSCULAR
  Filled 2016-03-18: qty 0.5

## 2016-03-18 MED ORDER — TRAZODONE HCL 100 MG PO TABS
100.0000 mg | ORAL_TABLET | Freq: Every evening | ORAL | Status: DC | PRN
Start: 2016-03-18 — End: 2016-03-19
  Administered 2016-03-18 (×2): 100 mg via ORAL
  Filled 2016-03-18 (×4): qty 1

## 2016-03-18 MED ORDER — DOXEPIN HCL 50 MG PO CAPS
50.0000 mg | ORAL_CAPSULE | Freq: Every day | ORAL | Status: DC
  Filled 2016-03-18: qty 1

## 2016-03-18 NOTE — Progress Notes (Signed)
CSW spoke with pt's wife with update on patient progress. She shared that her visit with him "didn't go well" last night, as patient did not want to pursue inpatient treatment despite her request. She asked for update on Saturday regarding pt's progress and to be notified if/when he is discharging. At this time, patient is not scheduled for a discharge over the weekend and has been reporting high depression and continued withdrawals. CSW left note for Mareida (weekend LCSW) regarding above.   Trula SladeHeather Smart, MSW, LCSW Clinical Social Worker 03/18/2016 3:00 PM

## 2016-03-18 NOTE — Progress Notes (Signed)
Patient ID: Tyler PileClarence K Coleman, male   DOB: 1955/05/22, 60 y.o.   MRN: [id number] D: Client seen in dayroom watching TV, reports anxiety today "8" of 10. Client reports not visitors today "she ain't been since I kicked her out of here day before yesterday, I don't need to hear all that" "I got thing going on, I'm facing a DUI, I got a lot going on" A: Writer provided emotional support, encouraged client to focus on one day at time and current recovery. Medications reviewed, administered as ordered. Staff will monitor q1315min for safety. R: Client is safe on the unit, did not attend group.

## 2016-03-18 NOTE — [facility] Group Notes (Signed)
[facility] LCSW Group Therapy  03/18/2016 2:23 PM  Type of Therapy:  Group Therapy  Participation Level:  Did Not Attend-pt chose to remain in bed.   Participation Quality: Summary of Progress/Problems: Feelings around Relapse. Group members discussed the meaning of relapse and shared personal stories of relapse, how it affected them and others, and how they perceived themselves during this time. Group members were encouraged to identify triggers, warning signs and coping skills used when facing the possibility of relapse. Social supports were discussed and explored in detail. Post Acute Withdrawal Syndrome (handout provided) was introduced and examined. Pt's were encouraged to ask questions, talk about key points associated with PAWS, and process this information in terms of relapse prevention.   Tyler Para N Smart LCSW 03/18/2016, 2:23 PM

## 2016-03-18 NOTE — Plan of Care (Signed)
Problem: Activity: Goal: Sleeping patterns will improve Outcome: Not Progressing Pt reports that he is still having difficulty sleeping at night.   Problem: Safety: Goal: Periods of time without injury will increase Outcome: Progressing No self injurious behavior observed. Pt denies si.

## 2016-03-18 NOTE — Progress Notes (Signed)
Recreation Therapy Notes  Date: 03/18/16 Time: 0930 Location: 300 Hall Dayroom  Group Topic: Stress Management  Goal Area(s) Addresses:  Patient will verbalize importance of using healthy stress management.  Patient will identify positive emotions associated with healthy stress management.   Intervention: Stress Management  Activity :  Progressive Muscle Relaxation.  LRT introduced the stress management technique of progressive muscle relaxation.  LRT read Coleman script to patients could partake in the activity.  Patients were to follow along as LRT read script.  Education:  Stress Management, Discharge Planning.   Education Outcome: Acknowledges edcuation/In group clarification offered/Needs additional education  Clinical Observations/Feedback: Pt did not attend  group.   Tyler Coleman, LRT/CTRS         Tyler RancherLindsay, Tyler Coleman 03/18/2016 12:34 PM

## 2016-03-18 NOTE — Progress Notes (Signed)
[facility] MD Progress Note  03/18/2016 10:14 AM Tyler Coleman  MRN:  [id number] Subjective: patient reports "  I am feeling okay, I guess I am still depressed."  Objective:Tyler Coleman is awake, alert and oriented * 3. Seen resting in bedroom. Denies suicidal or homicidal ideation. Denies auditory or visual hallucination and does not appear to be responding to internal stimuli. Patient reports he is still going through the detox process and is having a hard time. Patient reports he was unable to rest with the new medication that was started on last night and would like to be changed back to the trazodone that he was taken at home.  Patient reports he is medication compliant without mediation side effects. States his depression 8/10.patient reports he is unsure why all his medicaitons has been discontinued. Reports good appetite. Support, encouragement and reassurance was provided.   Principal Problem: MDD (major depressive disorder), recurrent severe, without psychosis ([place]) Diagnosis:   Patient Active Problem List   Diagnosis Date Noted  . Alcohol use disorder, moderate, dependence ([place]) [F10.20] 03/16/2016  . MDD (major depressive disorder), recurrent severe, without psychosis ([place]) [F33.2] 03/15/2016   Total Time spent with patient: 15 minutes  Past Psychiatric History: see H&P  Past Medical History:  Past Medical History:  Diagnosis Date  . Hx of mixed drug abuse     Past Surgical History:  Procedure Laterality Date  . BACK SURGERY     Family History:  Family History  Problem Relation Age of Onset  . Heart failure Mother   . Heart failure Father   . Mental illness Neg Hx    Family Psychiatric  History: see H&P Social History:  History  Alcohol Use  . 1.8 oz/week  . 3 Cans of beer per week     History  Drug Use  . Types: Cocaine    Social History   Social History  . Marital status: Single    Spouse name: N/A  . Number of children: N/A  . Years of  education: N/A   Social History Main Topics  . Smoking status: Light Tobacco Smoker    Packs/day: 0.25    Types: Cigarettes  . Smokeless tobacco: Never Used  . Alcohol use 1.8 oz/week    3 Cans of beer per week  . Drug use:     Types: Cocaine  . Sexual activity: Not Asked   Other Topics Concern  . None   Social History Narrative  . None   Additional Social History:                         Sleep: Fair  Appetite:  Good  Current Medications: Current Facility-Administered Medications  Medication Dose Route Frequency Provider Last Rate Last Dose  . acetaminophen (TYLENOL) tablet 650 mg  650 mg Oral Q4H PRN Patrecia Pour, NP   650 mg at 03/17/16 1511  . alum & mag hydroxide-simeth (MAALOX/MYLANTA) 200-200-20 MG/5ML suspension 30 mL  30 mL Oral PRN Patrecia Pour, NP      . aspirin EC tablet 81 mg  81 mg Oral Daily Patrecia Pour, NP   81 mg at 03/18/16 0839  . buPROPion (WELLBUTRIN XL) 24 hr tablet 150 mg  150 mg Oral Daily Benjamine Mola, FNP   150 mg at 03/18/16 0839  . doxepin (SINEQUAN) capsule 25 mg  25 mg Oral QHS,MR X 1 Benjamine Mola, FNP   25 mg at 03/17/16  2309  . ibuprofen (ADVIL,MOTRIN) tablet 600 mg  600 mg Oral Q8H PRN Patrecia Pour, NP   600 mg at 03/16/16 8563  . loperamide (IMODIUM) capsule 4 mg  4 mg Oral PRN Laverle Hobby, PA-C   4 mg at 03/17/16 1319  . loratadine (CLARITIN) tablet 10 mg  10 mg Oral Daily Benjamine Mola, FNP   10 mg at 03/18/16 0839  . LORazepam (ATIVAN) tablet 0-4 mg  0-4 mg Oral Q12H Patrecia Pour, NP   1 mg at 03/18/16 0840  . magnesium hydroxide (MILK OF MAGNESIA) suspension 30 mL  30 mL Oral Daily PRN Patrecia Pour, NP      . nicotine (NICODERM CQ - dosed in mg/24 hours) patch 21 mg  21 mg Transdermal Daily Patrecia Pour, NP   21 mg at 03/18/16 0837  . ondansetron (ZOFRAN) tablet 4 mg  4 mg Oral Q8H PRN Patrecia Pour, NP      . pantoprazole (PROTONIX) EC tablet 40 mg  40 mg Oral Daily Patrecia Pour, NP   40 mg at 03/18/16  0839  . thiamine (VITAMIN B-1) tablet 100 mg  100 mg Oral Daily Patrecia Pour, NP   100 mg at 03/18/16 1497   Or  . thiamine (B-1) injection 100 mg  100 mg Intravenous Daily Patrecia Pour, NP        Lab Results:  Results for orders placed or performed during the hospital encounter of 03/15/16 (from the past 48 hour(s))  Comprehensive metabolic panel     Status: Abnormal   Collection Time: 03/17/16  6:17 AM  Result Value Ref Range   Sodium 143 135 - 145 mmol/L   Potassium 3.2 (L) 3.5 - 5.1 mmol/L   Chloride 108 101 - 111 mmol/L   CO2 24 22 - 32 mmol/L   Glucose, Bld 90 65 - 99 mg/dL   BUN 14 6 - 20 mg/dL   Creatinine, Ser 0.84 0.61 - 1.24 mg/dL   Calcium 9.1 8.9 - 10.3 mg/dL   Total Protein 6.7 6.5 - 8.1 g/dL   Albumin 4.4 3.5 - 5.0 g/dL   AST 35 15 - 41 U/L   ALT 24 17 - 63 U/L   Alkaline Phosphatase 63 38 - 126 U/L   Total Bilirubin 1.1 0.3 - 1.2 mg/dL   GFR calc non Af Amer >60 >60 mL/min   GFR calc Af Amer >60 >60 mL/min    Comment: (NOTE) The eGFR has been calculated using the CKD EPI equation. This calculation has not been validated in all clinical situations. eGFR's persistently <60 mL/min signify possible Chronic Kidney Disease.    Anion gap 11 5 - 15    Comment: Performed at [facility]  Comprehensive metabolic panel     Status: Abnormal   Collection Time: 03/18/16  6:35 AM  Result Value Ref Range   Sodium 140 135 - 145 mmol/L   Potassium 3.4 (L) 3.5 - 5.1 mmol/L   Chloride 106 101 - 111 mmol/L   CO2 26 22 - 32 mmol/L   Glucose, Bld 92 65 - 99 mg/dL   BUN 14 6 - 20 mg/dL   Creatinine, Ser 0.91 0.61 - 1.24 mg/dL   Calcium 9.1 8.9 - 10.3 mg/dL   Total Protein 6.4 (L) 6.5 - 8.1 g/dL   Albumin 4.6 3.5 - 5.0 g/dL   AST 33 15 - 41 U/L   ALT 28 17 - 63 U/L  Alkaline Phosphatase 62 38 - 126 U/L   Total Bilirubin 1.3 (H) 0.3 - 1.2 mg/dL   GFR calc non Af Amer >60 >60 mL/min   GFR calc Af Amer >60 >60 mL/min    Comment: (NOTE) The eGFR has  been calculated using the CKD EPI equation. This calculation has not been validated in all clinical situations. eGFR's persistently <60 mL/min signify possible Chronic Kidney Disease.    Anion gap 8 5 - 15    Comment: Performed at [facility]    Blood Alcohol level:  Lab Results  Component Value Date   ETH 157 (H) 03/15/2016   ETH 211 (H) 36/62/9476    Metabolic Disorder Labs: No results found for: HGBA1C, MPG No results found for: PROLACTIN No results found for: CHOL, TRIG, HDL, CHOLHDL, VLDL, LDLCALC  Physical Findings: AIMS: Facial and Oral Movements Muscles of Facial Expression: None, normal Lips and Perioral Area: None, normal Jaw: None, normal Tongue: None, normal,Extremity Movements Upper (arms, wrists, hands, fingers): None, normal Lower (legs, knees, ankles, toes): None, normal, Trunk Movements Neck, shoulders, hips: None, normal, Overall Severity Severity of abnormal movements (highest score from questions above): None, normal Incapacitation due to abnormal movements: None, normal Patient's awareness of abnormal movements (rate only patient's report): No Awareness, Dental Status Current problems with teeth and/or dentures?: No Does patient usually wear dentures?: No  CIWA:  CIWA-Ar Total: 6 COWS:  COWS Total Score: 2  Musculoskeletal: Strength & Muscle Tone: within normal limits Gait & Station: normal Patient leans: N/A  Psychiatric Specialty Exam: Physical Exam  Nursing note and vitals reviewed. Constitutional: He is oriented to person, place, and time. He appears well-developed.  Cardiovascular: Normal rate.   Neurological: He is alert and oriented to person, place, and time.  Skin: Skin is warm and dry.  Psychiatric: He has a normal mood and affect. His behavior is normal.    Review of Systems  Psychiatric/Behavioral: Positive for depression and substance abuse. Negative for hallucinations and suicidal ideas. The patient is  nervous/anxious and has insomnia.     Blood pressure (!) 152/88, pulse 96, temperature 98.8 F (37.1 C), temperature source Oral, resp. rate 20, height '5\' 9"'  (1.753 m), weight 97.5 kg (215 lb), SpO2 100 %.Body mass index is 31.75 kg/m.  General Appearance: Casual  Eye Contact:  Good  Speech:  Clear and Coherent and Normal Rate  Volume:  Normal  Mood:  Anxious and Depressed  Affect:  Appropriate, Depressed and Flat  Thought Process:  Coherent, Goal Directed, Linear and Descriptions of Associations: Intact  Orientation:  Full (Time, Place, and Person)  Thought Content:  Focused on discharge plans, poor sleep  Suicidal Thoughts:  No  Homicidal Thoughts:  No  Memory:  Immediate;   Fair Recent;   Fair Remote;   Fair  Judgement:  Fair  Insight:  Fair  Psychomotor Activity:  Normal  Concentration:  Concentration: Fair and Attention Span: Fair  Recall:  [employer] of Knowledge:  Fair  Language:  Fair  Akathisia:  No  Handed:    AIMS (if indicated):     Assets:  Communication Skills Desire for Improvement Resilience Social Support  ADL's:  Intact  Cognition:  WNL  Sleep:  Number of Hours: 5.5    I agree with current treatment plan on 03/18/2016, Patient seen face-to-face for psychiatric evaluation follow-up, chart reviewed. Reviewed the information documented and agree with the treatment plan.  Treatment Plan Summary: MDD (major depressive disorder), recurrent severe,  without psychosis ([place]) unstable, warrants inpatient, managed as below:  Medications:  -Ativan/CIWA detox protocol -nicotine patch -protonix 32m po daily for chronic GERD restarted Trazodone 100 mg po qhs prn insomnia  Discontinued doxepin 25 mg to 50 mg for insomnia -continue Wellbutrin 150 mg XL daily for depression (and loss of motivation) -continue claritin for sneezing  TDerrill [place] NP 03/18/2016, 10:14 AM

## 2016-03-18 NOTE — Progress Notes (Signed)
D:Pt rates depression as a 3 and anxiety as a 7 on 0-10 scale with 10 being the most. Pt c/o poor sleep last night. He is slightly anxious and has been in bed with low energy.  A:Offered support, encouragement and 15 minute checks.  R:Pt denies si and hi. Safety maintained on the unit.

## 2016-03-19 DIAGNOSIS — Z818 Family history of other mental and behavioral disorders: Secondary | ICD-10-CM

## 2016-03-19 DIAGNOSIS — Z8249 Family history of ischemic heart disease and other diseases of the circulatory system: Secondary | ICD-10-CM

## 2016-03-19 DIAGNOSIS — F17210 Nicotine dependence, cigarettes, uncomplicated: Secondary | ICD-10-CM

## 2016-03-19 DIAGNOSIS — Z79899 Other long term (current) drug therapy: Secondary | ICD-10-CM

## 2016-03-19 DIAGNOSIS — F1020 Alcohol dependence, uncomplicated: Secondary | ICD-10-CM

## 2016-03-19 DIAGNOSIS — F332 Major depressive disorder, recurrent severe without psychotic features: Principal | ICD-10-CM

## 2016-03-19 MED ORDER — HYDROXYZINE HCL 25 MG PO TABS
25.0000 mg | ORAL_TABLET | Freq: Four times a day (QID) | ORAL | Status: DC | PRN
  Administered 2016-03-19 (×2): 25 mg via ORAL
  Filled 2016-03-19 (×2): qty 1

## 2016-03-19 MED ORDER — SALINE SPRAY 0.65 % NA SOLN
1.0000 | NASAL | Status: DC | PRN
Start: 2016-03-19 — End: 2016-03-21
  Administered 2016-03-19 (×4): 1 via NASAL
  Filled 2016-03-19: qty 44

## 2016-03-19 MED ORDER — TRAZODONE HCL 100 MG PO TABS
100.0000 mg | ORAL_TABLET | Freq: Every day | ORAL | Status: DC
  Administered 2016-03-19 – 2016-03-20 (×2): 100 mg via ORAL
  Filled 2016-03-19 (×5): qty 1

## 2016-03-19 MED ORDER — HYDROXYZINE HCL 50 MG PO TABS
50.0000 mg | ORAL_TABLET | Freq: Four times a day (QID) | ORAL | Status: DC | PRN
  Administered 2016-03-19 – 2016-03-20 (×3): 50 mg via ORAL
  Filled 2016-03-19 (×3): qty 1

## 2016-03-19 MED ORDER — LORAZEPAM 1 MG PO TABS
1.0000 mg | ORAL_TABLET | Freq: Four times a day (QID) | ORAL | Status: DC | PRN
  Administered 2016-03-19 – 2016-03-21 (×6): 1 mg via ORAL
  Filled 2016-03-19 (×6): qty 1

## 2016-03-19 MED ORDER — TRAZODONE HCL 100 MG PO TABS: 200.0000 mg | ORAL_TABLET | Freq: Every day | ORAL | Status: DC

## 2016-03-19 MED ORDER — MIRTAZAPINE 7.5 MG PO TABS
7.5000 mg | ORAL_TABLET | Freq: Every day | ORAL | Status: DC
Start: 2016-03-19 — End: 2016-03-21
  Administered 2016-03-19 – 2016-03-20 (×2): 7.5 mg via ORAL
  Filled 2016-03-19 (×5): qty 1

## 2016-03-19 NOTE — Progress Notes (Signed)
[facility] Group Notes:  (Nursing/MHT/Case Management/Adjunct)  Date:  03/19/2016  Time:  2045 Type of Therapy:  wrap up group  Participation Level:  Active  Participation Quality:  Appropriate, Attentive, Sharing and Supportive  Affect:  Flat  Cognitive:  Lacking  Insight:  Improving  Engagement in Group:  Engaged  Modes of Intervention:  Clarification, Education and Support  Summary of Progress/Problems:  Tyler Coleman, Tyler Coleman 03/19/2016, 10:00 PM

## 2016-03-19 NOTE — [facility] Group Notes (Signed)
[facility] Group Notes: (Clinical Social Work)   03/19/2016      Type of Therapy:  Group Therapy   Participation Level:  Did Not Attend despite MHT prompting   Johana Hopkinson Grossman-Orr, LCSW 03/19/2016, 12:29 PM     

## 2016-03-19 NOTE — Progress Notes (Signed)
Patient ID: [name], male   DOB: 01/08/1956, 60 y.o.   MRN: [id number]     D: Pt has been very agitated and irritable on the unit today. Pt continues to experience withdrawal symptoms and has required medication every 4 hours with very little relief. May NP made aware of patients condition, new orders noted for Ativan. Pt was given a dose of Ativan, with relief. Pt did not attend any groups nor did he engage in treatment. Pt reported that his depression was a 5, his hopelessness was a 5, and his anxiety was a 5. Pt reported that his goal was to do what he was told. Pt reported being negative SI/HI, no AH/VH noted. A: 15 min checks continued for patient safety. R: Pt safety maintained.

## 2016-03-19 NOTE — Progress Notes (Signed)
[facility] MD Progress Note  03/19/2016 10:43 AM Tyler Coleman  MRN:  [id number] Subjective: patient reports, "didn't really get good sleep."  Objective:Tyler Coleman is awake, alert and oriented 3. Seen resting in bedroom. Denies suicidal or homicidal ideation. Denies auditory or visual hallucination.  Patient reports he is still going through the detox process and is having a hard time. Patient reports he was unable to rest with the new medication that was started on last night and would like to be changed back to the trazodone that he was taking at home.  Patient reports he is medication compliant without mediation side effects. States he is still depressed. Reports good appetite. Support, encouragement and reassurance was provided.   Principal Problem: MDD (major depressive disorder), recurrent severe, without psychosis ([place]) Diagnosis:   Patient Active Problem List   Diagnosis Date Noted  . Alcohol use disorder, moderate, dependence ([place]) [F10.20] 03/16/2016  . MDD (major depressive disorder), recurrent severe, without psychosis ([place]) [F33.2] 03/15/2016   Total Time spent with patient: 15 minutes  Past Psychiatric History: see H&P  Past Medical History:  Past Medical History:  Diagnosis Date  . Hx of mixed drug abuse     Past Surgical History:  Procedure Laterality Date  . BACK SURGERY     Family History:  Family History  Problem Relation Age of Onset  . Heart failure Mother   . Heart failure Father   . Mental illness Neg Hx    Family Psychiatric  History: see H&P Social History:  History  Alcohol Use  . 1.8 oz/week  . 3 Cans of beer per week     History  Drug Use  . Types: Cocaine    Social History   Social History  . Marital status: Single    Spouse name: N/A  . Number of children: N/A  . Years of education: N/A   Social History Main Topics  . Smoking status: Light Tobacco Smoker    Packs/day: 0.25    Types: Cigarettes  . Smokeless tobacco: Never  Used  . Alcohol use 1.8 oz/week    3 Cans of beer per week  . Drug use:     Types: Cocaine  . Sexual activity: Not Asked   Other Topics Concern  . None   Social History Narrative  . None   Additional Social History:                         Sleep: Fair  Appetite:  Good  Current Medications: Current Facility-Administered Medications  Medication Dose Route Frequency Provider Last Rate Last Dose  . acetaminophen (TYLENOL) tablet 650 mg  650 mg Oral Q4H PRN Patrecia Pour, NP   650 mg at 03/19/16 0748  . alum & mag hydroxide-simeth (MAALOX/MYLANTA) 200-200-20 MG/5ML suspension 30 mL  30 mL Oral PRN Patrecia Pour, NP      . aspirin EC tablet 81 mg  81 mg Oral Daily Patrecia Pour, NP   81 mg at 03/19/16 0748  . buPROPion (WELLBUTRIN XL) 24 hr tablet 150 mg  150 mg Oral Daily Benjamine Mola, FNP   150 mg at 03/19/16 0748  . hydrOXYzine (ATARAX/VISTARIL) tablet 50 mg  50 mg Oral Q6H PRN Kerrie Buffalo, NP   50 mg at 03/19/16 4235  . ibuprofen (ADVIL,MOTRIN) tablet 600 mg  600 mg Oral Q8H PRN Patrecia Pour, NP   600 mg at 03/19/16 3614  . Influenza vac  split quadrivalent PF (FLUARIX) injection 0.5 mL  0.5 mL Intramuscular Tomorrow-1000 Kerrie Buffalo, NP      . loperamide (IMODIUM) capsule 4 mg  4 mg Oral PRN Laverle Hobby, PA-C   4 mg at 03/17/16 1319  . loratadine (CLARITIN) tablet 10 mg  10 mg Oral Daily Benjamine Mola, FNP   10 mg at 03/19/16 0748  . magnesium hydroxide (MILK OF MAGNESIA) suspension 30 mL  30 mL Oral Daily PRN Patrecia Pour, NP      . mirtazapine (REMERON) tablet 7.5 mg  7.5 mg Oral QHS Kerrie Buffalo, NP      . nicotine (NICODERM CQ - dosed in mg/24 hours) patch 21 mg  21 mg Transdermal Daily Patrecia Pour, NP   21 mg at 03/19/16 0748  . ondansetron (ZOFRAN) tablet 4 mg  4 mg Oral Q8H PRN Patrecia Pour, NP   4 mg at 03/18/16 1511  . pantoprazole (PROTONIX) EC tablet 40 mg  40 mg Oral Daily Patrecia Pour, NP   40 mg at 03/19/16 0748  . sodium  chloride (OCEAN) 0.65 % nasal spray 1 spray  1 spray Each Nare PRN Rozetta Nunnery, NP   1 spray at 03/19/16 [phone]  . thiamine (VITAMIN B-1) tablet 100 mg  100 mg Oral Daily Patrecia Pour, NP   100 mg at 03/19/16 5361   Or  . thiamine (B-1) injection 100 mg  100 mg Intravenous Daily Patrecia Pour, NP      . traZODone (DESYREL) tablet 100 mg  100 mg Oral QHS Kerrie Buffalo, NP        Lab Results:  Results for orders placed or performed during the hospital encounter of 03/15/16 (from the past 48 hour(s))  Comprehensive metabolic panel     Status: Abnormal   Collection Time: 03/18/16  6:35 AM  Result Value Ref Range   Sodium 140 135 - 145 mmol/L   Potassium 3.4 (L) 3.5 - 5.1 mmol/L   Chloride 106 101 - 111 mmol/L   CO2 26 22 - 32 mmol/L   Glucose, Bld 92 65 - 99 mg/dL   BUN 14 6 - 20 mg/dL   Creatinine, Ser 0.91 0.61 - 1.24 mg/dL   Calcium 9.1 8.9 - 10.3 mg/dL   Total Protein 6.4 (L) 6.5 - 8.1 g/dL   Albumin 4.6 3.5 - 5.0 g/dL   AST 33 15 - 41 U/L   ALT 28 17 - 63 U/L   Alkaline Phosphatase 62 38 - 126 U/L   Total Bilirubin 1.3 (H) 0.3 - 1.2 mg/dL   GFR calc non Af Amer >60 >60 mL/min   GFR calc Af Amer >60 >60 mL/min    Comment: (NOTE) The eGFR has been calculated using the CKD EPI equation. This calculation has not been validated in all clinical situations. eGFR's persistently <60 mL/min signify possible Chronic Kidney Disease.    Anion gap 8 5 - 15    Comment: Performed at [facility]    Blood Alcohol level:  Lab Results  Component Value Date   ETH 157 (H) 03/15/2016   ETH 211 (H) 44/31/5400    Metabolic Disorder Labs: No results found for: HGBA1C, MPG No results found for: PROLACTIN No results found for: CHOL, TRIG, HDL, CHOLHDL, VLDL, LDLCALC  Physical Findings: AIMS: Facial and Oral Movements Muscles of Facial Expression: None, normal Lips and Perioral Area: None, normal Jaw: None, normal Tongue: None, normal,Extremity Movements Upper  (arms,  wrists, hands, fingers): None, normal Lower (legs, knees, ankles, toes): None, normal, Trunk Movements Neck, shoulders, hips: None, normal, Overall Severity Severity of abnormal movements (highest score from questions above): None, normal Incapacitation due to abnormal movements: None, normal Patient's awareness of abnormal movements (rate only patient's report): No Awareness, Dental Status Current problems with teeth and/or dentures?: No Does patient usually wear dentures?: No  CIWA:  CIWA-Ar Total: 0 COWS:  COWS Total Score: 2  Musculoskeletal: Strength & Muscle Tone: within normal limits Gait & Station: normal Patient leans: N/A  Psychiatric Specialty Exam: Physical Exam  Nursing note and vitals reviewed. Constitutional: He is oriented to person, place, and time. He appears well-developed.  Cardiovascular: Normal rate.   Neurological: He is alert and oriented to person, place, and time.  Skin: Skin is warm and dry.  Psychiatric: He has a normal mood and affect. His behavior is normal.    Review of Systems  Psychiatric/Behavioral: Positive for depression and substance abuse. Negative for hallucinations and suicidal ideas. The patient is nervous/anxious and has insomnia.     Blood pressure (!) 147/84, pulse 99, temperature 97 F (36.1 C), resp. rate 20, height 5' 9" (1.753 m), weight 97.5 kg (215 lb), SpO2 100 %.Body mass index is 31.75 kg/m.  General Appearance: Casual  Eye Contact:  Good  Speech:  Clear and Coherent and Normal Rate  Volume:  Normal  Mood:  Anxious and Depressed  Affect:  Appropriate, Depressed and Flat  Thought Process:  Coherent, Goal Directed, Linear and Descriptions of Associations: Intact  Orientation:  Full (Time, Place, and Person)  Thought Content:  Focused on discharge plans, poor sleep  Suicidal Thoughts:  No  Homicidal Thoughts:  No  Memory:  Immediate;   Fair Recent;   Fair Remote;   Fair  Judgement:  Fair  Insight:  Fair   Psychomotor Activity:  Normal  Concentration:  Concentration: Fair and Attention Span: Fair  Recall:  [employer] of Knowledge:  Fair  Language:  Fair  Akathisia:  No  Handed:    AIMS (if indicated):     Assets:  Communication Skills Desire for Improvement Resilience Social Support  ADL's:  Intact  Cognition:  WNL  Sleep:  Number of Hours: 2.75   Treatment Plan Summary: MDD (major depressive disorder), recurrent severe, without psychosis ([place]) unstable, warrants inpatient, managed as below:  Medications:  -Ativan/CIWA detox protocol -Nicotine patch -Protonix 73m po daily for chronic GERD cont Trazodone 100 mg po qhs prn insomnia  -Added Remeron 7.5 mg QHS insomnia Discontinued doxepin 25 mg to 50 mg for insomnia -continue Wellbutrin 150 mg XL daily for depression (and loss of motivation) -continue Claritin for sneezing  SJanett Labella NP [facility]12/30/2017, 10:43 AM

## 2016-03-19 NOTE — Progress Notes (Signed)
D    Pt is irritable and anxious    He has been participating in unit activities    Pt displaying med seeking behaviors asking for everything he can get    Pt did mention his flu shot and he was told it would be administered at 10 in the morning as scheduled  A    Verbal support given    Medications administered and effectiveness monitored   Q 15 min checks    R    Pt safe and somewhat receptive to verbal support

## 2016-03-20 DIAGNOSIS — Z7982 Long term (current) use of aspirin: Secondary | ICD-10-CM

## 2016-03-20 MED ORDER — CLONIDINE HCL 0.2 MG PO TABS: 0.2000 mg | ORAL_TABLET | Freq: Once | ORAL | Status: DC

## 2016-03-20 MED ORDER — CLONIDINE HCL 0.2 MG PO TABS
0.2000 mg | ORAL_TABLET | Freq: Once | ORAL | Status: AC
  Administered 2016-03-20: 0.2 mg via ORAL

## 2016-03-20 MED ORDER — LISINOPRIL 10 MG PO TABS
10.0000 mg | ORAL_TABLET | Freq: Every day | ORAL | Status: DC
  Administered 2016-03-20 – 2016-03-21 (×2): 10 mg via ORAL
  Filled 2016-03-20 (×4): qty 1

## 2016-03-20 MED ORDER — LISINOPRIL 10 MG PO TABS
10.0000 mg | ORAL_TABLET | Freq: Every day | ORAL | Status: DC
  Filled 2016-03-20 (×2): qty 1

## 2016-03-20 NOTE — Progress Notes (Signed)
[facility]  03/20/2016 11:03 AM Tyler Coleman  MRN:  [id number] Subjective: patient appears anxious.  He is requesting his Ativan prn.  Objective:Tyler Coleman is awake, alert and oriented 3.  Denies suicidal or homicidal ideation. Denies auditory or visual hallucination.  Patient reports was able to rest with the new Remeron, coupled with Trazodone at the lowered dose of 100 mg, effective.  Although patient wants to go home and appears goal oriented, he is still requesting Ativan to help control anxiety symptoms.  Discussed that patient needed to be be assessed for possible lingering withdrawal symptoms.  Patient reports he is medication compliant without mediation side effects.  Reports good appetite. Support, encouragement and reassurance was provided.   Principal Problem: MDD (major depressive disorder), recurrent severe, without psychosis ([facility]) Diagnosis:   Patient Active Problem List   Diagnosis Date Noted  . Alcohol use disorder, moderate, dependence ([facility]) [F10.20] 03/16/2016  . MDD (major depressive disorder), recurrent severe, without psychosis ([facility]) [F33.2] 03/15/2016   Total Time spent with patient: 15 minutes  Past Psychiatric History: see H&P  Past Medical History:  Past Medical History:  Diagnosis Date  . Hx of mixed drug abuse     Past Surgical History:  Procedure Laterality Date  . BACK SURGERY     Family History:  Family History  Problem Relation Age of Onset  . Heart failure Mother   . Heart failure Father   . Mental illness Neg Hx    Family Psychiatric  History: see H&P Social History:  History  Alcohol Use  . 1.8 oz/week  . 3 Cans of beer per week     History  Drug Use  . Types: Cocaine    Social History   Social History  . Marital status: Single    Spouse name: N/A  . Number of children: N/A  . Years of education: N/A   Social History Main Topics  . Smoking status: Light Tobacco Smoker    Packs/day: 0.25    Types:  Cigarettes  . Smokeless tobacco: Never Used  . Alcohol use 1.8 oz/week    3 Cans of beer per week  . Drug use:     Types: Cocaine  . Sexual activity: Not Asked   Other Topics Concern  . None   Social History Narrative  . None   Additional Social History:                         Sleep: Fair  Appetite:  Good  Current Medications: Current Facility-Administered Medications  Medication Dose Route Frequency Provider Last Rate Last Dose  . acetaminophen (TYLENOL) tablet 650 mg  650 mg Oral Q4H PRN Charm RingsJamison Y Lord, NP   650 mg at 03/20/16 1052  . alum & mag hydroxide-simeth (MAALOX/MYLANTA) 200-200-20 MG/5ML suspension 30 mL  30 mL Oral PRN Charm RingsJamison Y Lord, NP   30 mL at 03/19/16 1114  . aspirin EC tablet 81 mg  81 mg Oral Daily Charm RingsJamison Y Lord, NP   81 mg at 03/20/16 0758  . buPROPion (WELLBUTRIN XL) 24 hr tablet 150 mg  150 mg Oral Daily Beau FannyJohn C Withrow, FNP   150 mg at 03/20/16 0757  . cloNIDine (CATAPRES) tablet 0.2 mg  0.2 mg Oral Once Myrlene Brokereborah R Ross, MD      . hydrOXYzine (ATARAX/VISTARIL) tablet 50 mg  50 mg Oral Q6H PRN Adonis BrookSheila Nyana Haren, NP   50 mg at 03/20/16 0757  .  ibuprofen (ADVIL,MOTRIN) tablet 600 mg  600 mg Oral Q8H PRN Charm Rings, NP   600 mg at 03/20/16 0757  . lisinopril (PRINIVIL,ZESTRIL) tablet 10 mg  10 mg Oral Daily Myrlene Broker, MD      . loperamide (IMODIUM) capsule 4 mg  4 mg Oral PRN Kerry Hough, PA-C   4 mg at 03/17/16 1319  . loratadine (CLARITIN) tablet 10 mg  10 mg Oral Daily Beau Fanny, FNP   10 mg at 03/20/16 0758  . LORazepam (ATIVAN) tablet 1 mg  1 mg Oral Q6H PRN Adonis Brook, NP   1 mg at 03/20/16 0757  . magnesium hydroxide (MILK OF MAGNESIA) suspension 30 mL  30 mL Oral Daily PRN Charm Rings, NP      . mirtazapine (REMERON) tablet 7.5 mg  7.5 mg Oral QHS Adonis Brook, NP   7.5 mg at 03/19/16 2124  . nicotine (NICODERM CQ - dosed in mg/24 hours) patch 21 mg  21 mg Transdermal Daily Charm Rings, NP   21 mg at 03/20/16 0758   . ondansetron (ZOFRAN) tablet 4 mg  4 mg Oral Q8H PRN Charm Rings, NP   4 mg at 03/18/16 1511  . pantoprazole (PROTONIX) EC tablet 40 mg  40 mg Oral Daily Charm Rings, NP   40 mg at 03/20/16 0758  . sodium chloride (OCEAN) 0.65 % nasal spray 1 spray  1 spray Each Nare PRN Jackelyn Poling, NP   1 spray at 03/19/16 2205  . thiamine (VITAMIN B-1) tablet 100 mg  100 mg Oral Daily Charm Rings, NP   100 mg at 03/20/16 0759  . traZODone (DESYREL) tablet 100 mg  100 mg Oral QHS Adonis Brook, NP   100 mg at 03/19/16 2122    Lab Results:  No results found for this or any previous visit (from the past 48 hour(s)).  Blood Alcohol level:  Lab Results  Component Value Date   ETH 157 (H) 03/15/2016   ETH 211 (H) 03/14/2016    Metabolic Disorder Labs: No results found for: HGBA1C, MPG No results found for: PROLACTIN No results found for: CHOL, TRIG, HDL, CHOLHDL, VLDL, LDLCALC  Physical Findings: AIMS: Facial and Oral Movements Muscles of Facial Expression: None, normal Lips and Perioral Area: None, normal Jaw: None, normal Tongue: None, normal,Extremity Movements Upper (arms, wrists, hands, fingers): None, normal Lower (legs, knees, ankles, toes): None, normal, Trunk Movements Neck, shoulders, hips: None, normal, Overall Severity Severity of abnormal movements (highest score from questions above): None, normal Incapacitation due to abnormal movements: None, normal Patient's awareness of abnormal movements (rate only patient's report): No Awareness, Dental Status Current problems with teeth and/or dentures?: No Does patient usually wear dentures?: No  CIWA:  CIWA-Ar Total: 8 COWS:  COWS Total Score: 2  Musculoskeletal: Strength & Muscle Tone: within normal limits Gait & Station: normal Patient leans: N/A  Psychiatric Specialty Exam: Physical Exam  Nursing note and vitals reviewed. Constitutional: He is oriented to person, place, and time. He appears well-developed.   Cardiovascular: Normal rate.   Neurological: He is alert and oriented to person, place, and time.  Skin: Skin is warm and dry.  Psychiatric: He has a normal mood and affect. His behavior is normal.    Review of Systems  Psychiatric/Behavioral: Positive for depression and substance abuse. Negative for hallucinations and suicidal ideas. The patient is nervous/anxious and has insomnia.   All other systems reviewed and are negative.  Blood pressure (!) 147/84, pulse 99, temperature 97 F (36.1 C), resp. rate 20, height 5\' 9"  (1.753 m), weight 97.5 kg (215 lb), SpO2 100 %.Body mass index is 31.75 kg/m.  General Appearance: Casual  Eye Contact:  Good  Speech:  Clear and Coherent and Normal Rate  Volume:  Normal  Mood:  Anxious and Depressed  Affect:  Appropriate, Depressed and Flat  Thought Process:  Coherent, Goal Directed, Linear and Descriptions of Associations: Intact  Orientation:  Full (Time, Place, and Person)  Thought Content:  Focused on discharge plans, poor sleep  Suicidal Thoughts:  No  Homicidal Thoughts:  No  Memory:  Immediate;   Fair Recent;   Fair Remote;   Fair  Judgement:  Fair  Insight:  Fair  Psychomotor Activity:  Normal  Concentration:  Concentration: Fair and Attention Span: Fair  Recall:  [employer]  Fund of Knowledge:  Fair  Language:  Fair  Akathisia:  No  Handed:    AIMS (if indicated):     Assets:  Communication Skills Desire for Improvement Resilience Social Support  ADL's:  Intact  Cognition:  WNL  Sleep:  Number of Hours: 3.5   Treatment Plan Summary: MDD (major depressive disorder), recurrent severe, without psychosis ([facility]) unstable, warrants inpatient, managed as below:  Medications:  -Ativan/CIWA detox protocol -Nicotine patch -Protonix 40mg  po daily for chronic GERD -cont Trazodone 100 mg po qhs prn insomnia  -Added Remeron 7.5 mg QHS insomnia Discontinued doxepin 25 mg to 50 mg for insomnia -continue Wellbutrin 150 mg XL daily for  depression (and loss of motivation) -continue Claritin for sneezing  Velna HatchetSheila May Keiandra Sullenger NP-BC 03/20/2016 11:11 am

## 2016-03-20 NOTE — Progress Notes (Signed)
Patient did attend the evening speaker AA meeting.  

## 2016-03-20 NOTE — Progress Notes (Signed)
D    Pt is irritable and anxious    He has been participating in unit activities    Pt displaying med seeking behaviors asking for everything he can get    Pt did mention his flu shot and he was told it would be administered at 10 in the morning as scheduled   Pt said he would be discharged in the morning   He asks for medications frequently and all that he can have A    Verbal support given    Medications administered and effectiveness monitored   Q 15 min checks    R    Pt safe and somewhat receptive to verbal support

## 2016-03-20 NOTE — Progress Notes (Signed)
Patient ID: Tyler Coleman, male   DOB: Oct 29, 1955, 60 y.o.   MRN: [id number]   D: Pt has been very agitated and irritable on the unit today. Pt continues to experience withdrawal symptoms and has required medication every 6 hours with very little relief. May NP made aware of patients condition, new orders noted for Clonidine and Lisinopril as his blood pressure was also high. Pt did not attend any groups nor did he engage in treatment. Pt reported that his depression was a 4, his hopelessness was a 3, and his anxiety was a 3. Pt reported that his goal was to go home. Pt reported being negative SI/HI, no AH/VH noted. A: 15 min checks continued for patient safety. R: Pt safety maintained.

## 2016-03-20 NOTE — [facility] Group Notes (Signed)
[facility] Group Notes:  (Clinical Social Work)  03/20/2016  1:30-2:15pm  Summary of Progress/Problems:   The main focus of today's process group was to write a goodbye letter to some element of 2017 that had negatively affected patients' lives, explaining how that thing (i.e., drinking, using drugs) used to be a "friend" but has now been causing harm and needs to be left behind.  An emphasis was placed on being able to say goodbye again to the same thing without feeling guilty that it has arisen again, as this is a normal grieving process.  Patients provided good support to each other.  The patient stated he wants to say goodbye to alcohol and drugs and have a clean life in 2018, listen to his family more and be closer to them.  He stated the last time he felt real joy was when he was 60yo and went to rehab, then stayed sober and felt "normal" over 10 years.  Type of Therapy:  Process Group  Participation Level:  Active  Participation Quality:  Attentive, Sharing and Supportive  Affect:  Appropriate  Cognitive:  Appropriate and Oriented  Insight:  Engaged  Engagement in Therapy:  Engaged  Modes of Intervention:   Education, Support and Processing, Activity  Ambrose MantleMareida Grossman-Orr, LCSW 03/20/2016

## 2016-03-21 MED ORDER — LISINOPRIL 10 MG PO TABS: 10.0000 mg | ORAL_TABLET | Freq: Every day | ORAL | 0 refills | Status: DC

## 2016-03-21 MED ORDER — PANTOPRAZOLE SODIUM 40 MG PO TBEC: 40.0000 mg | DELAYED_RELEASE_TABLET | Freq: Every day | ORAL | 0 refills | Status: DC

## 2016-03-21 MED ORDER — BUPROPION HCL ER (XL) 150 MG PO TB24: 150.0000 mg | ORAL_TABLET | Freq: Every day | ORAL | 0 refills | Status: DC

## 2016-03-21 MED ORDER — MIRTAZAPINE 7.5 MG PO TABS: 7.5000 mg | ORAL_TABLET | Freq: Every day | ORAL | 0 refills | Status: DC

## 2016-03-21 MED ORDER — NICOTINE 21 MG/24HR TD PT24: 21.0000 mg | MEDICATED_PATCH | Freq: Every day | TRANSDERMAL | 0 refills | Status: DC

## 2016-03-21 MED ORDER — HYDROXYZINE HCL 50 MG PO TABS: 50.0000 mg | ORAL_TABLET | Freq: Four times a day (QID) | ORAL | 0 refills | Status: DC | PRN

## 2016-03-21 MED ORDER — TRAZODONE HCL 100 MG PO TABS: 100.0000 mg | ORAL_TABLET | Freq: Every day | ORAL | 0 refills | Status: DC

## 2016-03-21 NOTE — Progress Notes (Signed)
Discharge Note:  Patient discharged home with family member.  Patient denied SI and HI.  Denied A/V hallucinations.  Patient stated he received all his belongings, clothing, toiletries, misc items, prescriptions.   Suicide prevention information given and discussed with patient who stated he understood and had no questions.  All required discharge information given to patient at discharge.

## 2016-03-21 NOTE — Discharge Summary (Signed)
Physician Discharge Summary Note  Patient:  Tyler Coleman is an 61 y.o., male MRN:  [id number] DOB:  03-Mar-1956 Patient phone:  [phone] (home)  Patient address:   [address] [place] [address],  Total Time spent with patient: 30 minutes  Date of Admission:  03/15/2016 Date of Discharge: 03/21/2016  Reason for Admission:  Substance abuse  Principal Problem: MDD (major depressive disorder), recurrent severe, without psychosis [facility]) Discharge Diagnoses: Patient Active Problem List   Diagnosis Date Noted  . Alcohol use disorder, moderate, dependence (HCC) [F10.20] 03/16/2016  . MDD (major depressive disorder), recurrent severe, without psychosis (HCC) [F33.2] 03/15/2016    Past Psychiatric History: see HPI  Past Medical History:  Past Medical History:  Diagnosis Date  . Hx of mixed drug abuse     Past Surgical History:  Procedure Laterality Date  . BACK SURGERY     Family History:  Family History  Problem Relation Age of Onset  . Heart failure Mother   . Heart failure Father   . Mental illness Neg Hx    Family Psychiatric  History: see HPI Social History:  History  Alcohol Use  . 1.8 oz/week  . 3 Cans of beer per week     History  Drug Use  . Types: Cocaine    Social History   Social History  . Marital status: Single    Spouse name: N/A  . Number of children: N/A  . Years of education: N/A   Social History Main Topics  . Smoking status: Light Tobacco Smoker    Packs/day: 0.25    Types: Cigarettes  . Smokeless tobacco: Never Used  . Alcohol use 1.8 oz/week    3 Cans of beer per week  . Drug use:     Types: Cocaine  . Sexual activity: Not Asked   Other Topics Concern  . None   Social History Narrative  . None    Hospital Course:   Tyler Coleman was admitted for MDD (major depressive disorder), recurrent severe, without psychosis (HCC) and crisis management.  Patient was treated with medications with their  indications listed below in detail under Medication List.  Medical problems were identified and treated as needed.  Home medications were restarted as appropriate.  Improvement was monitored by observation and Maryellen Pile daily report of symptom reduction.  Emotional and mental status was monitored by daily self inventory reports completed by Maryellen Pile and clinical staff.  Patient reported continued improvement, denied any new concerns.  Patient had been compliant on medications and denied side effects.  Support and encouragement was provided.          [name] was evaluated by the treatment team for stability and plans for continued recovery upon discharge.  Patient was offered further treatment options upon discharge including Residential, Intensive Outpatient and Outpatient treatment. Patient will follow up with agency listed below for medication management and counseling.  Encouraged patient to maintain satisfactory support network and home environment.  Advised to adhere to medication compliance and outpatient treatment follow up.  Prescriptions provided.       Maryellen Pile motivation was an integral factor for scheduling further treatment.  Employment, transportation, bed availability, health status, family support, and any pending legal issues were also considered during patient's hospital stay.  Upon completion of this admission the patient was both mentally and medically stable for discharge denying suicidal/homicidal ideation, auditory/visual/tactile hallucinations, delusional thoughts and paranoia.  Physical Findings: AIMS: Facial and Oral Movements Muscles of Facial Expression: None, normal Lips and Perioral Area: None, normal Jaw: None, normal Tongue: None, normal,Extremity Movements Upper (arms, wrists, hands, fingers): None, normal Lower (legs, knees, ankles, toes): None, normal, Trunk Movements Neck, shoulders, hips: None, normal, Overall  Severity Severity of abnormal movements (highest score from questions above): None, normal Incapacitation due to abnormal movements: None, normal Patient's awareness of abnormal movements (rate only patient's report): No Awareness, Dental Status Current problems with teeth and/or dentures?: No Does patient usually wear dentures?: No  CIWA:  CIWA-Ar Total: 1 COWS:  COWS Total Score: 2  Musculoskeletal: Strength & Muscle Tone: within normal limits Gait & Station: normal Patient leans: N/A  Psychiatric Specialty Exam:  SEE MD SRA Physical Exam  Nursing note and vitals reviewed. Psychiatric: He has a normal mood and affect. His speech is normal and behavior is normal. Judgment and thought content normal. Cognition and memory are normal.    ROS  Blood pressure 121/82, pulse 82, temperature 97.7 F (36.5 C), resp. rate 16, height 5\' 9"  (1.753 m), weight 97.5 kg (215 lb), SpO2 100 %.Body mass index is 31.75 kg/m.    Has this patient used any form of tobacco in the last 30 days? (Cigarettes, Smokeless Tobacco, Cigars, and/or Pipes) Yes, N/A  Blood Alcohol level:  Lab Results  Component Value Date   ETH 157 (H) 03/15/2016   ETH 211 (H) 03/14/2016    Metabolic Disorder Labs:  No results found for: HGBA1C, MPG No results found for: PROLACTIN No results found for: CHOL, TRIG, HDL, CHOLHDL, VLDL, LDLCALC  See Psychiatric Specialty Exam and Suicide Risk Assessment completed by Attending Physician prior to discharge.  Discharge destination:  Home  Is patient on multiple antipsychotic therapies at discharge:  No   Has Patient had three or more failed trials of antipsychotic monotherapy by history:  No  Recommended Plan for Multiple Antipsychotic Therapies: NA   Allergies as of 03/21/2016   No Known Allergies     Medication List    STOP taking these medications   aspirin EC 81 MG tablet   VIIBRYD 40 MG Tabs Generic drug:  Vilazodone HCl   zolpidem 10 MG tablet Commonly  known as:  AMBIEN     TAKE these medications     Indication  buPROPion 150 MG 24 hr tablet Commonly known as:  WELLBUTRIN XL Take 1 tablet (150 mg total) by mouth daily. Start taking on:  03/22/2016  Indication:  Major Depressive Disorder   hydrOXYzine 50 MG tablet Commonly known as:  ATARAX/VISTARIL Take 1 tablet (50 mg total) by mouth every 6 (six) hours as needed for anxiety.  Indication:  Anxiety Neurosis   lisinopril 10 MG tablet Commonly known as:  PRINIVIL,ZESTRIL Take 1 tablet (10 mg total) by mouth daily. Start taking on:  03/22/2016  Indication:  High Blood Pressure Disorder   mirtazapine 7.5 MG tablet Commonly known as:  REMERON Take 1 tablet (7.5 mg total) by mouth at bedtime.  Indication:  Trouble Sleeping   nicotine 21 mg/24hr patch Commonly known as:  NICODERM CQ - dosed in mg/24 hours Place 1 patch (21 mg total) onto the skin daily. Start taking on:  03/22/2016  Indication:  Nicotine Addiction   pantoprazole 40 MG tablet Commonly known as:  PROTONIX Take 1 tablet (40 mg total) by mouth daily. Start taking on:  03/22/2016  Indication:  Gastroesophageal Reflux Disease   traZODone 100 MG tablet Commonly known as:  DESYREL  Take 1 tablet (100 mg total) by mouth at bedtime.  Indication:  Major Depressive Disorder      Follow-up Information    [facility] Follow up on 03/24/2016.   Why:  Appt on Thursday, 03/24/16 with Melissa for counseling. Appt with Dr. Lajean Silvius at 12:30PM on this date as well. Thank you.  Contact information: [address] [place], [place] [address] Phone: [phone] Fax: [phone]          Follow-up recommendations:  Activity:  as tol Diet:  as tol  Comments:  1.  Take all your medications as prescribed.   2.  Report any adverse side effects to outpatient provider. 3.  Patient instructed to not use alcohol or illegal drugs while on prescription medicines. 4.  In the event of worsening symptoms, instructed patient to call 911,  the crisis hotline or go to nearest emergency room for evaluation of symptoms.  Signed: Lindwood Qua, NP [facility] 03/21/2016, 2:58 PM

## 2016-03-21 NOTE — [facility] Suicide Risk Assessment (Signed)
[facility]   Principal Problem: MDD (major depressive disorder), recurrent severe, without psychosis (HCC) Discharge Diagnoses:  Patient Active Problem List   Diagnosis Date Noted  . Alcohol use disorder, moderate, dependence (HCC) [F10.20] 03/16/2016  . MDD (major depressive disorder), recurrent severe, without psychosis (HCC) [F33.2] 03/15/2016    Total Time spent with patient: 30 minutes  Musculoskeletal: Strength & Muscle Tone: within normal limits Gait & Station: normal Patient leans: N/A  Psychiatric Specialty Exam: Review of Systems  Psychiatric/Behavioral: Positive for substance abuse. Negative for depression and hallucinations.  All other systems reviewed and are negative.   Blood pressure 121/82, pulse 82, temperature 97.7 F (36.5 C), resp. rate 16, height 5\' 9"  (1.753 m), weight 97.5 kg (215 lb), SpO2 100 %.Body mass index is 31.75 kg/m.  General Appearance: Casual  Eye Contact::  Fair  Speech:  Normal Rate409  Volume:  Normal  Mood:  Euthymic  Affect:  Appropriate  Thought Process:  Goal Directed and Descriptions of Associations: Intact  Orientation:  Full (Time, Place, and Person)  Thought Content:  Logical  Suicidal Thoughts:  No  Homicidal Thoughts:  No  Memory:  Immediate;   Fair Recent;   Fair Remote;   Fair  Judgement:  Fair  Insight:  Fair  Psychomotor Activity:  Normal  Concentration:  Fair  Recall:  F[employer]Fair  Fund of Knowledge:Fair  Language: Fair  Akathisia:  No  Handed:  Right  AIMS (if indicated):     Assets:  Communication Skills Desire for Improvement  Sleep:  Number of Hours: 2.5  Cognition: WNL  ADL's:  Intact   Mental Status Per Nursing Assessment::   On Admission:     Demographic Factors:  Male and Caucasian  Loss Factors: NA  Historical Factors: Impulsivity  Risk Reduction Factors:   Positive social support  Continued Clinical Symptoms:  Alcohol/Substance Abuse/Dependencies Previous  Psychiatric Diagnoses and Treatments  Cognitive Features That Contribute To Risk:  None    Suicide Risk:  Minimal: No identifiable suicidal ideation.  Patients presenting with no risk factors but with morbid ruminations; may be classified as minimal risk based on the severity of the depressive symptoms  Follow-up Information    [facility] Follow up on 03/24/2016.   Why:  Appt on Thursday, 03/24/16 with Melissa for counseling. Appt with Dr. Lajean Silviusena at 12:30PM on this date as well. Thank you.  Contact information: [address] [place], [place] [address] Phone: [phone] Fax: [phone]          Plan Of Care/Follow-up recommendations:  Activity:  no restrictions Other:  follow up with aftercare  Nazirah Tri, MD 03/21/2016, 9:31 AM

## 2016-03-21 NOTE — Progress Notes (Signed)
D:  Patient's self inventory sheet, patient sleeps good, sleep medication not helpful.  Good appetite, normal energy level, good concentration.  Rated depression and anxiety 4, hopeless 3.  Denied withdrawals.  Denied SI.  Denied phyical problems.  Denied pain.  Goal is to go home.  Plans to talk to MD.  No discharge plans. A:  Medications administered per MD orders.  Emotional support and encouragement given patient. R:  Denied SI and HI, contracts for safety.  Denied A/V hallucinations.  Safety maintained with 15 minute checks.

## 2016-03-21 NOTE — Progress Notes (Signed)
  [facility] :  Will you be returning to the same living situation after discharge:  Yes,  home At discharge, do you have transportation home?: Yes,  "my son will pick me up." Do you have the ability to pay for your medications: Yes,  [facility]  Release of information consent forms completed and submitted to medical records by CSW.  Patient to Follow up at: Follow-up Information    [facility] Follow up on 03/24/2016.   Why:  Appt on Thursday, 03/24/16 with Melissa for counseling. Appt with Dr. Lajean Silviusena at 12:30PM on this date as well. Thank you.  Contact information: [address] [place], [place] [address] Phone: [phone] Fax: [phone]          Next level of care provider has access to [facility] Link:no  Safety Planning and Suicide Prevention discussed: Yes,  SPE completed with pt and his wife. SPI pamphlet and Mobile Crisis information provided to pt     Has patient been referred to the Quitline?: Patient refused referral  Patient has been referred for addiction treatment: Yes  Broox Lonigro N Smart LCSW 03/21/2016, 9:22 AM

## 2016-03-21 NOTE — Tx Team (Signed)
Interdisciplinary Treatment and Diagnostic Plan Update  03/21/2016 Time of Session: 9:30AM Tyler Coleman MRN: [id number]  Principal Diagnosis: MDD (major depressive disorder), recurrent severe, without psychosis ([name])  Secondary Diagnoses: Principal Problem:   MDD (major depressive disorder), recurrent severe, without psychosis ([name]) Active Problems:   Alcohol use disorder, moderate, dependence ([name])   Current Medications:  Current Facility-Administered Medications  Medication Dose Route Frequency Provider Last Rate Last Dose  . acetaminophen (TYLENOL) tablet 650 mg  650 mg Oral Q4H PRN Patrecia Pour, NP   650 mg at 03/20/16 1052  . alum & mag hydroxide-simeth (MAALOX/MYLANTA) 200-200-20 MG/5ML suspension 30 mL  30 mL Oral PRN Patrecia Pour, NP   30 mL at 03/19/16 1114  . aspirin EC tablet 81 mg  81 mg Oral Daily Patrecia Pour, NP   81 mg at 03/21/16 0759  . buPROPion (WELLBUTRIN XL) 24 hr tablet 150 mg  150 mg Oral Daily Benjamine Mola, FNP   150 mg at 03/21/16 0759  . cloNIDine (CATAPRES) tablet 0.2 mg  0.2 mg Oral Once Cloria Spring, MD      . hydrOXYzine (ATARAX/VISTARIL) tablet 50 mg  50 mg Oral Q6H PRN Kerrie Buffalo, NP   50 mg at 03/20/16 2117  . ibuprofen (ADVIL,MOTRIN) tablet 600 mg  600 mg Oral Q8H PRN Patrecia Pour, NP   600 mg at 03/20/16 2118  . lisinopril (PRINIVIL,ZESTRIL) tablet 10 mg  10 mg Oral Daily Kerrie Buffalo, NP   10 mg at 03/21/16 0800  . loperamide (IMODIUM) capsule 4 mg  4 mg Oral PRN Laverle Hobby, PA-C   4 mg at 03/17/16 1319  . loratadine (CLARITIN) tablet 10 mg  10 mg Oral Daily Benjamine Mola, FNP   10 mg at 03/21/16 0800  . LORazepam (ATIVAN) tablet 1 mg  1 mg Oral Q6H PRN Kerrie Buffalo, NP   1 mg at 03/21/16 0303  . magnesium hydroxide (MILK OF MAGNESIA) suspension 30 mL  30 mL Oral Daily PRN Patrecia Pour, NP      . mirtazapine (REMERON) tablet 7.5 mg  7.5 mg Oral QHS Kerrie Buffalo, NP   7.5 mg at 03/20/16 2118  . nicotine (NICODERM CQ  - dosed in mg/24 hours) patch 21 mg  21 mg Transdermal Daily Patrecia Pour, NP   21 mg at 03/21/16 0800  . ondansetron (ZOFRAN) tablet 4 mg  4 mg Oral Q8H PRN Patrecia Pour, NP   4 mg at 03/18/16 1511  . pantoprazole (PROTONIX) EC tablet 40 mg  40 mg Oral Daily Patrecia Pour, NP   40 mg at 03/21/16 0802  . sodium chloride (OCEAN) 0.65 % nasal spray 1 spray  1 spray Each Nare PRN Rozetta Nunnery, NP   1 spray at 03/19/16 2205  . thiamine (VITAMIN B-1) tablet 100 mg  100 mg Oral Daily Patrecia Pour, NP   100 mg at 03/21/16 0800  . traZODone (DESYREL) tablet 100 mg  100 mg Oral QHS Kerrie Buffalo, NP   100 mg at 03/20/16 2117   PTA Medications: Prescriptions Prior to Admission  Medication Sig Dispense Refill Last Dose  . aspirin EC 81 MG tablet Take 81 mg by mouth daily.    Past Week at Unknown time  . pantoprazole (PROTONIX) 40 MG tablet Take 40 mg by mouth daily.  5 Past Week at Unknown time  . VIIBRYD 40 MG TABS Take 40 mg by mouth daily.  5  Past Week at Unknown time  . zolpidem (AMBIEN) 10 MG tablet Take 10 mg by mouth at bedtime as needed for sleep.   5 Past Week at Unknown time    Patient Stressors: Marital or family conflict Substance abuse  Patient Strengths: Capable of independent living Physical Health Supportive family/friends  Treatment Modalities: Medication Management, Group therapy, Case management,  1 to 1 session with clinician, Psychoeducation, Recreational therapy.   Physician Treatment Plan for Primary Diagnosis: MDD (major depressive disorder), recurrent severe, without psychosis ([place]) Long Term Goal(s): Improvement in symptoms so as ready for discharge Improvement in symptoms so as ready for discharge   Short Term Goals: Ability to identify changes in lifestyle to reduce recurrence of condition will improve Ability to verbalize feelings will improve Ability to disclose and discuss suicidal ideas Ability to demonstrate self-control will improve Ability to  identify and develop effective coping behaviors will improve Ability to maintain clinical measurements within normal limits will improve Compliance with prescribed medications will improve Ability to identify triggers associated with substance abuse/mental health issues will improve Ability to identify changes in lifestyle to reduce recurrence of condition will improve Ability to verbalize feelings will improve Ability to disclose and discuss suicidal ideas Ability to demonstrate self-control will improve Ability to identify and develop effective coping behaviors will improve Ability to maintain clinical measurements within normal limits will improve Compliance with prescribed medications will improve Ability to identify triggers associated with substance abuse/mental health issues will improve  Medication Management: Evaluate patient's response, side effects, and tolerance of medication regimen.  Therapeutic Interventions: 1 to 1 sessions, Unit Group sessions and Medication administration.  Evaluation of Outcomes: Met  Physician Treatment Plan for Secondary Diagnosis: Principal Problem:   MDD (major depressive disorder), recurrent severe, without psychosis ([place]) Active Problems:   Alcohol use disorder, moderate, dependence ([place])  Long Term Goal(s): Improvement in symptoms so as ready for discharge Improvement in symptoms so as ready for discharge   Short Term Goals: Ability to identify changes in lifestyle to reduce recurrence of condition will improve Ability to verbalize feelings will improve Ability to disclose and discuss suicidal ideas Ability to demonstrate self-control will improve Ability to identify and develop effective coping behaviors will improve Ability to maintain clinical measurements within normal limits will improve Compliance with prescribed medications will improve Ability to identify triggers associated with substance abuse/mental health issues will  improve Ability to identify changes in lifestyle to reduce recurrence of condition will improve Ability to verbalize feelings will improve Ability to disclose and discuss suicidal ideas Ability to demonstrate self-control will improve Ability to identify and develop effective coping behaviors will improve Ability to maintain clinical measurements within normal limits will improve Compliance with prescribed medications will improve Ability to identify triggers associated with substance abuse/mental health issues will improve     Medication Management: Evaluate patient's response, side effects, and tolerance of medication regimen.  Therapeutic Interventions: 1 to 1 sessions, Unit Group sessions and Medication administration.  Evaluation of Outcomes: Met   RN Treatment Plan for Primary Diagnosis: MDD (major depressive disorder), recurrent severe, without psychosis ([place]) Long Term Goal(s): Knowledge of disease and therapeutic regimen to maintain health will improve  Short Term Goals: Ability to remain free from injury will improve, Ability to disclose and discuss suicidal ideas and Ability to identify and develop effective coping behaviors will improve  Medication Management: RN will administer medications as ordered by provider, will assess and evaluate patient's response and provide education to patient for prescribed  medication. RN will report any adverse and/or side effects to prescribing provider.  Therapeutic Interventions: 1 on 1 counseling sessions, Psychoeducation, Medication administration, Evaluate responses to treatment, Monitor vital signs and CBGs as ordered, Perform/monitor CIWA, COWS, AIMS and Fall Risk screenings as ordered, Perform wound care treatments as ordered.  Evaluation of Outcomes: Met  LCSW Treatment Plan for Primary Diagnosis: MDD (major depressive disorder), recurrent severe, without psychosis ([place]) Long Term Goal(s): Safe transition to appropriate next level of  care at discharge, Engage patient in therapeutic group addressing interpersonal concerns.  Short Term Goals: Engage patient in aftercare planning with referrals and resources, Facilitate patient progression through stages of change regarding substance use diagnoses and concerns and Identify triggers associated with mental health/substance abuse issues  Therapeutic Interventions: Assess for all discharge needs, 1 to 1 time with Social worker, Explore available resources and support systems, Assess for adequacy in community support network, Educate family and significant other(s) on suicide prevention, Complete Psychosocial Assessment, Interpersonal group therapy.  Evaluation of Outcomes: Met  Progress in Treatment: Attending groups: Yes Participating in groups: Yes Taking medication as prescribed: Yes. Toleration medication: Yes. Family/Significant other contact made: SPE completed with pt's wife.  Patient understands diagnosis: Yes. Discussing patient identified problems/goals with staff: Yes. Medical problems stabilized or resolved: Yes. Denies suicidal/homicidal ideation: Yes. Issues/concerns per patient self-inventory: No. Other: n/a  New problem(s) identified: Guns are not in patient's possession per his wife their son has guns.   New Short Term/Long Term Goal(s):  Discharge Plan or Barriers: Pt plans to follow-up at [facility]--appts made. He will be returning home at discharge.   Reason for Continuation of Hospitalization: none  Estimated Length of Stay:  D/c today   Attendees: Patient: 03/21/2016 9:24 AM  Physician: Dr. Shea Evans MD 03/21/2016 9:24 AM  Nursing: Nevada Crane RN 03/21/2016 9:24 AM  RN Care Manager: Rhunette Croft 03/21/2016 9:24 AM  Social Worker: Maxie Better, LCSW; Edwyna Shell LCSW 03/21/2016 9:24 AM  Recreational Therapist:  03/21/2016 9:24 AM  Other: May Augustin NP 03/21/2016 9:24 AM  Other:  03/21/2016 9:24 AM  Other: 03/21/2016 9:24 AM    Scribe for Treatment  Team: Joshua, LCSW 03/21/2016 9:24 AM

## 2016-03-21 NOTE — [facility] Group Notes (Signed)
[facility] LCSW Group Therapy  03/21/2016 2:57 PM  Type of Therapy:  Group Therapy  Participation Level:  Did Not Attend    Dinesha Twiggs C Ronne Stefanski 03/21/2016, 2:57 PM 

## 2016-04-12 ENCOUNTER — Emergency Department (HOSPITAL_COMMUNITY)
Admission: EM | Admit: 2016-04-12 | Discharge: 2016-04-12 | Disposition: A | Discharge status: Home / Self Care | Payer: Commercial Managed Care - HMO | Attending: Emergency Medicine | Admitting: Emergency Medicine

## 2016-04-12 ENCOUNTER — Encounter (HOSPITAL_COMMUNITY): Payer: Self-pay

## 2016-04-12 DIAGNOSIS — T401X1A Poisoning by heroin, accidental (unintentional), initial encounter: Secondary | ICD-10-CM | POA: Insufficient documentation

## 2016-04-12 DIAGNOSIS — F17210 Nicotine dependence, cigarettes, uncomplicated: Secondary | ICD-10-CM | POA: Diagnosis not present

## 2016-04-12 NOTE — ED Notes (Signed)
Pt is alert and oriented x 4, Pt is c/o of a HA, and states that he feels like he is going to have a panic attack.

## 2016-04-12 NOTE — ED Triage Notes (Signed)
Pt brought in by EMS brought in from home, pt was found at home by wife, Pt was unconscious with agonal breathing as per EMS, pt was give 1 mg of IV narcan and stimulated pt to consciousness. Pt reports to EMS that he took unknown amount of heroin. Pt upon arrival was alert and verbally responsive. Pt appears calm and is without apparent distress.

## 2016-04-12 NOTE — ED Notes (Signed)
Bed: RESB Expected date:  Expected time:  Means of arrival:  Comments: Overdose 

## 2016-04-12 NOTE — Discharge Instructions (Signed)
Avoid all illegal substances

## 2016-04-12 NOTE — ED Provider Notes (Signed)
[facility]-EMERGENCY DEPT Provider Note   CSN: [id number] Arrival date & time: 04/12/16  2032     History   Chief Complaint Chief Complaint  Patient presents with  . Drug Overdose    HPI Maryellen PileClarence K Mcalpine is a 61 y.o. male.  The patient was found unresponsive in his bedroom after injecting heroin. His wife was able to get him to the floor, and called 911. First responders treated with Narcan with resumption of normal mental status. He states he last had heroin overdose a couple of years ago. He denies headache, chest pain, cough, shortness of breath, weakness or dizziness at this time. There are no other known modifying factors.   HPI  Past Medical History:  Diagnosis Date  . Hx of mixed drug abuse     Patient Active Problem List   Diagnosis Date Noted  . Alcohol use disorder, moderate, dependence (HCC) 03/16/2016  . MDD (major depressive disorder), recurrent severe, without psychosis (HCC) 03/15/2016    Past Surgical History:  Procedure Laterality Date  . BACK SURGERY         Home Medications    Prior to Admission medications   Medication Sig Start Date End Date Taking? Authorizing Provider  ALPRAZolam Prudy Feeler(XANAX) 1 MG tablet Take 0.5-1 tablets by mouth 2 (two) times daily. Takes 0.5 MG in the morning and 1 MG at bedtime 03/23/16  Yes Historical Provider, MD  hydrOXYzine (ATARAX/VISTARIL) 50 MG tablet Take 1 tablet (50 mg total) by mouth every 6 (six) hours as needed for anxiety. 03/21/16  Yes Adonis BrookSheila Agustin, NP  mirtazapine (REMERON) 7.5 MG tablet Take 1 tablet (7.5 mg total) by mouth at bedtime. 03/21/16  Yes Adonis BrookSheila Agustin, NP  pantoprazole (PROTONIX) 40 MG tablet Take 1 tablet (40 mg total) by mouth daily. 03/22/16  Yes Adonis BrookSheila Agustin, NP  traZODone (DESYREL) 100 MG tablet Take 1 tablet (100 mg total) by mouth at bedtime. 03/21/16  Yes Adonis BrookSheila Agustin, NP  buPROPion (WELLBUTRIN XL) 150 MG 24 hr tablet Take 1 tablet (150 mg total) by mouth daily. Patient not taking: Reported on  04/12/2016 03/22/16   Adonis BrookSheila Agustin, NP  lisinopril (PRINIVIL,ZESTRIL) 10 MG tablet Take 1 tablet (10 mg total) by mouth daily. Patient not taking: Reported on 04/12/2016 03/22/16   Adonis BrookSheila Agustin, NP  nicotine (NICODERM CQ - DOSED IN MG/24 HOURS) 21 mg/24hr patch Place 1 patch (21 mg total) onto the skin daily. Patient not taking: Reported on 04/12/2016 03/22/16   Adonis BrookSheila Agustin, NP    Family History Family History  Problem Relation Age of Onset  . Heart failure Mother   . Heart failure Father   . Mental illness Neg Hx     Social History Social History  Substance Use Topics  . Smoking status: Light Tobacco Smoker    Packs/day: 0.25    Types: Cigarettes  . Smokeless tobacco: Never Used  . Alcohol use 1.8 oz/week    3 Cans of beer per week     Allergies   Patient has no known allergies.   Review of Systems Review of Systems  All other systems reviewed and are negative.    Physical Exam Updated Vital Signs BP 149/81 (BP Location: Left Arm)   Pulse 86   Resp 21   Ht 6' (1.829 m)   Wt 215 lb (97.5 kg)   SpO2 98%   BMI 29.16 kg/m   Physical Exam  Constitutional: He is oriented to person, place, and time. He appears well-developed and well-nourished.  HENT:  Head: Normocephalic and atraumatic.  Right Ear: External ear normal.  Left Ear: External ear normal.  Eyes: Conjunctivae and EOM are normal. Pupils are equal, round, and reactive to light.  Neck: Normal range of motion and phonation normal. Neck supple.  Cardiovascular: Normal rate, regular rhythm and normal heart sounds.   Pulmonary/Chest: Effort normal and breath sounds normal. He exhibits no bony tenderness.  Musculoskeletal: Normal range of motion.  Neurological: He is alert and oriented to person, place, and time. No cranial nerve deficit or sensory deficit. He exhibits normal muscle tone. Coordination normal.  Skin: Skin is warm, dry and intact.  Psychiatric: He has a normal mood and affect. His behavior is  normal. Judgment and thought content normal.  Nursing note and vitals reviewed.    ED Treatments / Results  Labs (all labs ordered are listed, but only abnormal results are displayed) Labs Reviewed - No data to display  EKG  EKG Interpretation None       Radiology No results found.  Procedures Procedures (including critical care time)  Medications Ordered in ED Medications - No data to display   Initial Impression / Assessment and Plan / ED Course  I have reviewed the triage vital signs and the nursing notes.  Pertinent labs & imaging results that were available during my care of the patient were reviewed by me and considered in my medical decision making (see chart for details).  Clinical Course as of Apr 13 2227  Tue Apr 12, 2016  2105 Patient currently alert, talking with wife and understands that he will require a period of observation, prior to discharge.  [EW]    Clinical Course User Index [EW] Mancel Bale, MD    Medications - No data to display  Patient Vitals for the past 24 hrs:  BP Pulse Resp SpO2 Height Weight  04/12/16 2149 149/81 86 21 98 % - -  04/12/16 2042 151/88 84 12 96 % - -  04/12/16 2041 - 84 11 96 % 6' (1.829 m) 215 lb (97.5 kg)  04/12/16 2038 - - - 98 % - -    10:28 PM Reevaluation with update and discussion. After initial assessment and treatment, an updated evaluation reveals He remains alert and comfortable. No further complaints. Findings discussed with the patient, all questions were answered. Zilda No L    Final Clinical Impressions(s) / ED Diagnoses   Final diagnoses:  Accidental overdose of heroin, initial encounter   Narcotic overdose, uncomplicated.  Nursing Notes Reviewed/ Care Coordinated Applicable Imaging Reviewed Interpretation of Laboratory Data incorporated into ED treatment  The patient appears reasonably screened and/or stabilized for discharge and I doubt any other medical condition or other [facility]  requiring further screening, evaluation, or treatment in the ED at this time prior to discharge.  Plan: Home Medications- cintinue; Home Treatments- avoid illegal substances; return here if the recommended treatment, does not improve the symptoms; Recommended follow up- PCP prn    New Prescriptions New Prescriptions   No medications on file     Mancel Bale, MD 04/12/16 2230

## 2016-05-09 ENCOUNTER — Ambulatory Visit (HOSPITAL_COMMUNITY): Payer: Self-pay | Admitting: Psychiatry

## 2017-02-17 DIAGNOSIS — I10 Essential (primary) hypertension: Secondary | ICD-10-CM | POA: Insufficient documentation

## 2017-03-10 ENCOUNTER — Other Ambulatory Visit: Payer: Self-pay | Admitting: Sports Medicine

## 2017-03-10 DIAGNOSIS — M545 Low back pain: Secondary | ICD-10-CM

## 2017-03-19 ENCOUNTER — Ambulatory Visit
Admission: RE | Admit: 2017-03-19 | Discharge: 2017-03-19 | Disposition: A | Discharge status: Home / Self Care | Payer: Commercial Managed Care - HMO | Source: Ambulatory Visit | Attending: Sports Medicine | Admitting: Sports Medicine

## 2017-03-19 DIAGNOSIS — M545 Low back pain: Secondary | ICD-10-CM

## 2018-03-21 HISTORY — PX: ESOPHAGOGASTRODUODENOSCOPY (EGD) WITH ESOPHAGEAL DILATION: SHX5812

## 2019-03-31 DIAGNOSIS — L821 Other seborrheic keratosis: Secondary | ICD-10-CM | POA: Insufficient documentation

## 2019-03-31 DIAGNOSIS — L570 Actinic keratosis: Secondary | ICD-10-CM | POA: Insufficient documentation

## 2019-04-21 ENCOUNTER — Ambulatory Visit: Payer: Commercial Managed Care - HMO

## 2019-05-02 ENCOUNTER — Ambulatory Visit: Payer: Commercial Managed Care - HMO

## 2019-11-12 ENCOUNTER — Ambulatory Visit
Admission: RE | Admit: 2019-11-12 | Discharge: 2019-11-12 | Disposition: A | Discharge status: Home / Self Care | Payer: Commercial Managed Care - HMO | Source: Ambulatory Visit | Attending: Physician Assistant | Admitting: Physician Assistant

## 2019-11-12 ENCOUNTER — Other Ambulatory Visit: Payer: Self-pay | Admitting: Physician Assistant

## 2019-11-12 ENCOUNTER — Other Ambulatory Visit: Payer: Self-pay

## 2019-11-12 DIAGNOSIS — R042 Hemoptysis: Secondary | ICD-10-CM

## 2019-11-12 MED ORDER — IOPAMIDOL (ISOVUE-370) INJECTION 76%
75.0000 mL | Freq: Once | INTRAVENOUS | Status: AC | PRN
  Administered 2019-11-12: 13:00:00 75 mL via INTRAVENOUS

## 2021-06-22 HISTORY — PX: COLONOSCOPY WITH PROPOFOL: SHX5780

## 2021-07-02 ENCOUNTER — Emergency Department (HOSPITAL_COMMUNITY)
Admission: EM | Admit: 2021-07-02 | Discharge: 2021-07-02 | Disposition: A | Discharge status: Home / Self Care | Payer: Medicare Other | Attending: Emergency Medicine | Admitting: Emergency Medicine

## 2021-07-02 ENCOUNTER — Encounter (HOSPITAL_COMMUNITY): Payer: Self-pay

## 2021-07-02 DIAGNOSIS — R531 Weakness: Secondary | ICD-10-CM | POA: Diagnosis present

## 2021-07-02 DIAGNOSIS — E860 Dehydration: Secondary | ICD-10-CM | POA: Diagnosis not present

## 2021-07-02 LAB — CBC
HCT: 43.5 % (ref 39.0–52.0)
Hemoglobin: 14.6 g/dL (ref 13.0–17.0)
MCH: 28.2 pg (ref 26.0–34.0)
MCHC: 33.6 g/dL (ref 30.0–36.0)
MCV: 84 fL (ref 80.0–100.0)
Platelets: 213 10*3/uL (ref 150–400)
RBC: 5.18 MIL/uL (ref 4.22–5.81)
RDW: 13.2 % (ref 11.5–15.5)
WBC: 8.6 10*3/uL (ref 4.0–10.5)
nRBC: 0 % (ref 0.0–0.2)

## 2021-07-02 LAB — URINALYSIS, ROUTINE W REFLEX MICROSCOPIC
Bilirubin Urine: NEGATIVE
Glucose, UA: NEGATIVE mg/dL
Hgb urine dipstick: NEGATIVE
Ketones, ur: NEGATIVE mg/dL
Leukocytes,Ua: NEGATIVE
Nitrite: NEGATIVE
Protein, ur: NEGATIVE mg/dL
Specific Gravity, Urine: 1.003 — ABNORMAL LOW (ref 1.005–1.030)
pH: 6 (ref 5.0–8.0)

## 2021-07-02 LAB — BASIC METABOLIC PANEL
Anion gap: 6 (ref 5–15)
BUN: 10 mg/dL (ref 8–23)
CO2: 28 mmol/L (ref 22–32)
Calcium: 9.1 mg/dL (ref 8.9–10.3)
Chloride: 103 mmol/L (ref 98–111)
Creatinine, Ser: 0.74 mg/dL (ref 0.61–1.24)
GFR, Estimated: >60 mL/min (ref >60)
Glucose, Bld: 111 mg/dL — ABNORMAL HIGH (ref 70–99)
Potassium: 4 mmol/L (ref 3.5–5.1)
Sodium: 137 mmol/L (ref 135–145)

## 2021-07-02 LAB — CBG MONITORING, ED: Glucose-Capillary: 107 mg/dL — ABNORMAL HIGH (ref 70–99)

## 2021-07-02 MED ORDER — LACTATED RINGERS IV BOLUS
1000.0000 mL | Freq: Once | INTRAVENOUS | Status: AC
  Administered 2021-07-02: 1000 mL via INTRAVENOUS

## 2021-07-02 NOTE — ED Triage Notes (Signed)
Pt arrives POV for eval of possible dehydration. Pt reports he has felt weak and lightheaded the last few days, reports dry mouth and lips. States he is trying to drink water, but still feels dehydrated. Denies any pain. Pt reports is urinating normally  ? ?

## 2021-07-02 NOTE — Discharge Instructions (Signed)
Please follow-up with your primary care provider in 2 to 3 days.  Your work-up in the emergency department was grossly unremarkable.  You have gotten a liter of IV hydration.  Please continue to aggressively hydrate.  ? ?If you start having worsening symptoms such as fever, severe abdominal pain, or feel like you need to be reevaluated please come back to the emergency department. ? ?

## 2021-07-02 NOTE — ED Notes (Signed)
RN reviewed discharge instructions with pt. Pt verbalized understanding and had no further questions. VSS upon discharge.  

## 2021-07-02 NOTE — ED Provider Notes (Signed)
?[facility] EMERGENCY DEPARTMENT ?Provider Note ? ? ?CSN: [id number] ?Arrival date & time: 07/02/21  1128 ? ?  ? ?History ? ?Chief Complaint  ?Patient presents with  ? Weakness  ? ? ?Tyler Coleman is a 66 y.o. male. ? ? ?Weakness ? ?Patient is 66 year old male with a history of substance use disorder on Suboxone who presents to the emergency department for poor p.o. intake and concern for dehydration.  He reports his mouth feels dry.  He has also been feeling fatigue and weakness for the past few weeks.  He recently got evaluated for similar symptoms including a colonoscopy.  He report his colonoscopy was unremarkable.  Denies associated syncope.  Denies emesis or diarrhea.  He feels like he is constipated and has been taking Metamucil.  Denies associated fever, cough or congestion.  He is concerned that he is dehydrated.  Denies chest pain or shortness of breath.  Otherwise no other complaints. ? ?Home Medications ?Prior to Admission medications   ?Medication Sig Start Date End Date Taking? Authorizing Provider  ?ALPRAZolam (XANAX) 1 MG tablet Take 0.5-1 tablets by mouth 2 (two) times daily. Takes 0.5 MG in the morning and 1 MG at bedtime 03/23/16   [provider]  ?buPROPion (WELLBUTRIN XL) 150 MG 24 hr tablet Take 1 tablet (150 mg total) by mouth daily. ?Patient not taking: Reported on 04/12/2016 03/22/16   Adonis Brook, NP  ?hydrOXYzine (ATARAX/VISTARIL) 50 MG tablet Take 1 tablet (50 mg total) by mouth every 6 (six) hours as needed for anxiety. 03/21/16   Adonis Brook, NP  ?lisinopril (PRINIVIL,ZESTRIL) 10 MG tablet Take 1 tablet (10 mg total) by mouth daily. ?Patient not taking: Reported on 04/12/2016 03/22/16   Adonis Brook, NP  ?mirtazapine (REMERON) 7.5 MG tablet Take 1 tablet (7.5 mg total) by mouth at bedtime. 03/21/16   Adonis Brook, NP  ?nicotine (NICODERM CQ - DOSED IN MG/24 HOURS) 21 mg/24hr patch Place 1 patch (21 mg total) onto the skin daily. ?Patient not taking:  Reported on 04/12/2016 03/22/16   Adonis Brook, NP  ?pantoprazole (PROTONIX) 40 MG tablet Take 1 tablet (40 mg total) by mouth daily. 03/22/16   Adonis Brook, NP  ?traZODone (DESYREL) 100 MG tablet Take 1 tablet (100 mg total) by mouth at bedtime. 03/21/16   Adonis Brook, NP  ?   ? ?Allergies    ?Thorazine [chlorpromazine]   ? ?Review of Systems   ?Review of Systems  ?Neurological:  Positive for weakness.  ? ?Physical Exam ?Updated Vital Signs ?BP (!) 156/85   Pulse (!) 59   Temp 98.3 ?F (36.8 ?C) (Oral)   Resp 18   Ht 6' (1.829 m)   Wt 98 kg   SpO2 97%   BMI 29.30 kg/m?  ?Physical Exam ?Constitutional:   ?   General: He is not in acute distress. ?   Appearance: He is not ill-appearing.  ?HENT:  ?   Head: Normocephalic.  ?   Nose: Nose normal.  ?   Mouth/Throat:  ?   Mouth: Mucous membranes are dry.  ?Eyes:  ?   Extraocular Movements: Extraocular movements intact.  ?   Pupils: Pupils are equal, round, and reactive to light.  ?Cardiovascular:  ?   Rate and Rhythm: Normal rate.  ?Pulmonary:  ?   Effort: No respiratory distress.  ?   Breath sounds: No wheezing or rales.  ?Chest:  ?   Chest wall: No tenderness.  ?Abdominal:  ?   General: Abdomen  is flat.  ?   Tenderness: There is no abdominal tenderness. There is no right CVA tenderness, left CVA tenderness, guarding or rebound.  ?Musculoskeletal:     ?   General: No swelling, tenderness, deformity or signs of injury. Normal range of motion.  ?   Cervical back: Normal range of motion. No rigidity.  ?   Right lower leg: No edema.  ?   Left lower leg: No edema.  ?Skin: ?   General: Skin is dry.  ?   Capillary Refill: Capillary refill takes 2 to 3 seconds.  ?Neurological:  ?   General: No focal deficit present.  ?   Mental Status: He is alert.  ?   Cranial Nerves: No cranial nerve deficit.  ?   Motor: No weakness.  ?Psychiatric:     ?   Mood and Affect: Mood normal.  ? ? ?ED Results / Procedures / Treatments   ?Labs ?(all labs ordered are listed, but only abnormal  results are displayed) ?Labs Reviewed  ?BASIC METABOLIC PANEL - Abnormal; Notable for the following components:  ?    Result Value  ? Glucose, Bld 111 (*)   ? All other components within normal limits  ?URINALYSIS, ROUTINE W REFLEX MICROSCOPIC - Abnormal; Notable for the following components:  ? Color, Urine STRAW (*)   ? Specific Gravity, Urine 1.003 (*)   ? All other components within normal limits  ?CBG MONITORING, ED - Abnormal; Notable for the following components:  ? Glucose-Capillary 107 (*)   ? All other components within normal limits  ?CBC  ? ? ?EKG ?EKG Interpretation ? ?Date/Time:  Friday July 02 2021 11:49:44 EDT ?Ventricular Rate:  83 ?PR Interval:  158 ?QRS Duration: 94 ?QT Interval:  380 ?QTC Calculation: 446 ?R Axis:   2 ?Text Interpretation: Normal sinus rhythm Nonspecific ST abnormality Abnormal ECG No previous ECGs available Confirmed by Benjiman Core [phone]) on 07/02/2021 8:13:39 PM ? ?Radiology ?No results found. ? ?Procedures ?Procedures  ? ?Medications Ordered in ED ?Medications  ?lactated ringers bolus 1,000 mL (0 mLs Intravenous Stopped 07/02/21 2150)  ? ? ?ED Course/ Medical Decision Making/ A&P ?  ?                        ?Medical Decision Making ?Problems Addressed: ?Dehydration: acute illness or injury that poses a threat to life or bodily functions ?Weakness: acute illness or injury that poses a threat to life or bodily functions ? ?Amount and/or Complexity of Data Reviewed ?Labs: ordered. Decision-making details documented in ED Course. ?ECG/medicine tests: ordered and independent interpretation performed. Decision-making details documented in ED Course. ? ? ?Patient is 66 year old male presents to the emergency department due to concern for dehydration and decreased p.o. intake.  Patient vital signs remarkable for mildly elevated blood pressure.  Otherwise within the reference range.  Physical exam without acute abdomen.  Patient does have dry oral mucosa which is concerning for  mild to moderate dehydration.  Patient is neurovascular intact without any focal deficit. ? ?Patient presentation most likely secondary to decreased p.o. intake and most likely benign in etiology.  Differential considered are metabolic derangement vs cancerous etiology.  However, patient did get colonoscopy recently for similar presentation which was grossly unremarkable.  At this time less concern for serious intra-abdominal etiology such as hepatobiliary etiology or mesenteric etiology. ? ?Patient CBC without leukocytosis.  Hemoglobin hematocrit are unremarkable.  CMP without severe metabolic derangement.  His creatinine is 0.7.  No signs of AKI at this time.  Urinalysis for low specific gravity which is consistent with mild to moderate dehydration.  Patient EKG with normal sinus rhythm.  No ischemic changes at this time.  No arrhythmias. ? ?Intervention: Gave patient LR bolus. ? ?On reassessment, patient continued to be well-appearing.  Strict return precaution discussed.  Recommend follow-up with PCP in 1 to 2 days. ? ? ?Final Clinical Impression(s) / ED Diagnoses ?Final diagnoses:  ?Weakness  ?Dehydration  ? ?  ?Jari SportsmanWodajo, Suri Tafolla T, MD ?07/02/21 2223 ? ?  ?Benjiman CorePickering, Nathan, MD ?07/03/21 1449 ? ?

## 2021-07-02 NOTE — ED Provider Triage Note (Signed)
Emergency Medicine Provider Triage Evaluation Note ? ?Tyler Coleman , a 66 y.o. male  was evaluated in triage.  Pt complains of fatigue and weakness.  Reports he feels as though he may be dehydrated.  History of hypertension, does not check his blood pressure at home.  Denies any dizziness or syncope but reports feeling lightheaded occasionally.  No nausea, vomiting or diarrhea but some constipation. ?Review of Systems  ?Positive: Lightheadedness and feelings of dehydration ?Negative: Nausea, vomiting, diarrhea, recent illness, dizziness or syncope ? ?Physical Exam  ?BP (!) 153/83 (BP Location: Right Arm)   Pulse 82   Temp 98.4 ?F (36.9 ?C) (Oral)   Resp 17   Ht 6' (1.829 m)   Wt 98 kg   SpO2 97%   BMI 29.30 kg/m?  ?Gen:   Awake, no distress   ?Resp:  Normal effort  ?MSK:   Moves extremities without difficulty  ?Other:  Normal skin turgor.  Mucous membranes are not dry however patient drinking water in triage. ? ?Medical Decision Making  ?Medically screening exam initiated at 12:30 PM.  Appropriate orders placed.  Tyler Coleman was informed that the remainder of the evaluation will be completed by another provider, this initial triage assessment does not replace that evaluation, and the importance of remaining in the ED until their evaluation is complete. ? ? ?  ?Saddie Benders, PA-C ?07/02/21 1232 ? ?

## 2021-07-20 DIAGNOSIS — I779 Disorder of arteries and arterioles, unspecified: Secondary | ICD-10-CM | POA: Insufficient documentation

## 2021-09-22 ENCOUNTER — Other Ambulatory Visit: Payer: Self-pay | Admitting: Gastroenterology

## 2021-09-22 DIAGNOSIS — R1310 Dysphagia, unspecified: Secondary | ICD-10-CM

## 2021-09-24 ENCOUNTER — Ambulatory Visit
Admission: RE | Admit: 2021-09-24 | Discharge: 2021-09-24 | Disposition: A | Discharge status: Home / Self Care | Payer: Medicare Other | Source: Ambulatory Visit | Attending: Gastroenterology | Admitting: Gastroenterology

## 2021-09-24 DIAGNOSIS — R1310 Dysphagia, unspecified: Secondary | ICD-10-CM

## 2021-11-11 ENCOUNTER — Telehealth: Payer: Self-pay

## 2021-11-11 ENCOUNTER — Other Ambulatory Visit: Payer: Self-pay

## 2021-11-11 DIAGNOSIS — K220 Achalasia of cardia: Secondary | ICD-10-CM

## 2021-11-11 NOTE — Telephone Encounter (Signed)
Referral for esophageal manometry Referring provider Dr Feliciana Rossetti Appointment and procedure information to the patient. Spoke with the spouse of the patient also.

## 2021-12-02 ENCOUNTER — Other Ambulatory Visit: Payer: Self-pay | Admitting: Urology

## 2021-12-06 ENCOUNTER — Encounter (HOSPITAL_BASED_OUTPATIENT_CLINIC_OR_DEPARTMENT_OTHER): Payer: Self-pay | Admitting: Urology

## 2021-12-09 ENCOUNTER — Encounter (HOSPITAL_BASED_OUTPATIENT_CLINIC_OR_DEPARTMENT_OTHER): Payer: Self-pay | Admitting: Urology

## 2021-12-09 NOTE — Progress Notes (Signed)
   12/09/21 1051  OBSTRUCTIVE SLEEP APNEA  Have you ever been diagnosed with sleep apnea through a sleep study? No  Do you snore loudly (loud enough to be heard through closed doors)?  1  Do you often feel tired, fatigued, or sleepy during the daytime (such as falling asleep during driving or talking to someone)? 0  Has anyone observed you stop breathing during your sleep? 1  Do you have, or are you being treated for high blood pressure? 1  BMI more than 35 kg/m2? 0  Age > 71 (1-yes) 1  Male Gender (Yes=1) 1  Obstructive Sleep Apnea Score 5  Score 5 or greater  Results sent to PCP

## 2021-12-09 NOTE — Progress Notes (Addendum)
Addendum:  reviewed pt chart w/ anesthesia, Dr Christella Hartigan MDA.  Stated pt ok to proceed @ [facility] and have pt to stop suboxone 3 days prior to [name]. Called and spoke w/ pt's wife, [name], since pt not available.  [name] verbalized understanding that anesthesia wants pt to stop suboxone 3 days prior to surgery.   Spoke w/ via phone for pre-op interview--- pt Lab needs dos----   [employer] results------ current ekg in epic/ chart COVID test -----patient states asymptomatic no test needed Arrive at ------- 0630 on 12-22-2021 NPO after MN with exception sips of water with medication  (pt has delayed gastric emptying ) Med rec completed Medications to take morning of surgery ----- celexa, pepcid, protonix, klonopin Diabetic medication ----- n/a Patient instructed no nail polish to be worn day of surgery Patient instructed to bring photo id and insurance card day of surgery Patient aware to have Driver (ride ) / caregiver  for 24 hours after surgery --- wife, annette Patient Special Instructions ----- reviewed [facility] and visitor guidelines.  Pt told not to take benazepril morning surgery.  Will call pt back after speak with anesthesia for Suboxone instructions. Pre-Op special Istructions -----  received pt's  pcp medical clearance , Bing Matter PA, dated 12-08-2021 via fax from Dr Cain Sieve office, placed in chart. Patient verbalized understanding of instructions that were given at this phone interview. Patient denies shortness of breath, chest pain, fever, cough at this phone interview.   Anesthesia Review:   HTN;  polysubstance abuse , alcohol use disorder, and opioid dependence on suboxone daily.   Achalasia and delayed gastric emptying per Dr Lenetta Quaker note in epic 10-22-2021 pt has esophgeal manometry scheduled 03-23-2022. Pt stated last drank alcohol 6 months ago.  Stated last drug use 2018 IV heroin overdose ED visit in epic, given narcan. When asked pt about swallowing issues stated at times  will throw back up what he has eaten even water , stated at times daily occurrence or may go up to two weeks before is happens again.  Pt scored 5 using stop bang tool, routed to pt's pcp.  PCP:  Bing Matter PA (lov 08-02-2021 epic)

## 2021-12-20 NOTE — Progress Notes (Signed)
Spoke with patient wife annette and reviewed medications to take day of surgery 12-22-2021: celexa, pepcid, protonix, klonopin, stop suboxone 3 days before surgery, patient wife [name] vocalized understanding of instructions.

## 2021-12-21 NOTE — Anesthesia Preprocedure Evaluation (Signed)
Anesthesia Evaluation  Patient identified by MRN, date of birth, ID band Patient awake    Reviewed: Allergy & Precautions, NPO status , Patient's Chart, lab work & pertinent test results  Airway Mallampati: II  TM Distance: >3 FB Neck ROM: Full    Dental no notable dental hx.    Pulmonary former smoker,    Pulmonary exam normal        Cardiovascular hypertension, Pt. on medications Normal cardiovascular exam     Neuro/Psych PSYCHIATRIC DISORDERS Anxiety Depression negative neurological ROS     GI/Hepatic GERD  Medicated and Controlled,(+)     substance abuse  ,   Endo/Other  negative endocrine ROS  Renal/GU negative Renal ROS     Musculoskeletal  (+) Arthritis ,   Abdominal   Peds  Hematology negative hematology ROS (+)   Anesthesia Other Findings ERECTILE DYSFUNCTION  Reproductive/Obstetrics                            Anesthesia Physical Anesthesia Plan  ASA: 3  Anesthesia Plan: General   Post-op Pain Management:    Induction: Intravenous  PONV Risk Score and Plan: 3 and Ondansetron, Dexamethasone, Midazolam and Treatment may vary due to age or medical condition  Airway Management Planned: LMA  Additional Equipment:   Intra-op Plan:   Post-operative Plan: Extubation in OR  Informed Consent: I have reviewed the patients History and Physical, chart, labs and discussed the procedure including the risks, benefits and alternatives for the proposed anesthesia with the patient or authorized representative who has indicated his/her understanding and acceptance.     Dental advisory given  Plan Discussed with: CRNA  Anesthesia Plan Comments:        Anesthesia Quick Evaluation

## 2021-12-22 ENCOUNTER — Observation Stay (HOSPITAL_BASED_OUTPATIENT_CLINIC_OR_DEPARTMENT_OTHER)
Admission: RE | Admit: 2021-12-22 | Discharge: 2021-12-23 | Disposition: A | Discharge status: Home / Self Care | Payer: Medicare Other | Source: Ambulatory Visit | Attending: Urology | Admitting: Urology

## 2021-12-22 ENCOUNTER — Ambulatory Visit (HOSPITAL_BASED_OUTPATIENT_CLINIC_OR_DEPARTMENT_OTHER): Payer: Medicare Other | Admitting: Anesthesiology

## 2021-12-22 ENCOUNTER — Encounter (HOSPITAL_BASED_OUTPATIENT_CLINIC_OR_DEPARTMENT_OTHER): Payer: Self-pay | Admitting: Urology

## 2021-12-22 ENCOUNTER — Other Ambulatory Visit: Payer: Self-pay

## 2021-12-22 ENCOUNTER — Encounter (HOSPITAL_BASED_OUTPATIENT_CLINIC_OR_DEPARTMENT_OTHER)
Admission: RE | Disposition: A | Discharge status: Home / Self Care | Payer: Self-pay | Source: Ambulatory Visit | Attending: Urology

## 2021-12-22 DIAGNOSIS — I10 Essential (primary) hypertension: Secondary | ICD-10-CM | POA: Diagnosis not present

## 2021-12-22 DIAGNOSIS — N529 Male erectile dysfunction, unspecified: Principal | ICD-10-CM | POA: Insufficient documentation

## 2021-12-22 DIAGNOSIS — Z960 Presence of urogenital implants: Secondary | ICD-10-CM

## 2021-12-22 DIAGNOSIS — F418 Other specified anxiety disorders: Secondary | ICD-10-CM | POA: Diagnosis not present

## 2021-12-22 DIAGNOSIS — M1990 Unspecified osteoarthritis, unspecified site: Secondary | ICD-10-CM | POA: Diagnosis not present

## 2021-12-22 DIAGNOSIS — Z87891 Personal history of nicotine dependence: Secondary | ICD-10-CM | POA: Insufficient documentation

## 2021-12-22 DIAGNOSIS — Z79899 Other long term (current) drug therapy: Secondary | ICD-10-CM | POA: Diagnosis not present

## 2021-12-22 DIAGNOSIS — Z01818 Encounter for other preprocedural examination: Secondary | ICD-10-CM

## 2021-12-22 HISTORY — DX: Other complications of anesthesia, initial encounter: T88.59XA

## 2021-12-22 HISTORY — DX: Other intervertebral disc degeneration, lumbar region: M51.36

## 2021-12-22 HISTORY — DX: Achalasia of cardia: K22.0

## 2021-12-22 HISTORY — DX: Essential (primary) hypertension: I10

## 2021-12-22 HISTORY — DX: Depression, unspecified: F32.A

## 2021-12-22 HISTORY — DX: Gastro-esophageal reflux disease without esophagitis: K21.9

## 2021-12-22 HISTORY — DX: Testicular hypofunction: E29.1

## 2021-12-22 HISTORY — DX: Male erectile dysfunction, unspecified: N52.9

## 2021-12-22 HISTORY — PX: PENILE PROSTHESIS IMPLANT: SHX240

## 2021-12-22 HISTORY — DX: Anxiety disorder, unspecified: F41.9

## 2021-12-22 HISTORY — DX: Unspecified osteoarthritis, unspecified site: M19.90

## 2021-12-22 HISTORY — DX: Alcohol dependence, uncomplicated: F10.20

## 2021-12-22 HISTORY — DX: Other intervertebral disc degeneration, lumbar region without mention of lumbar back pain or lower extremity pain: M51.369

## 2021-12-22 HISTORY — DX: Mixed hyperlipidemia: E78.2

## 2021-12-22 HISTORY — DX: Opioid dependence, uncomplicated: F11.20

## 2021-12-22 HISTORY — DX: Drug induced constipation: K59.03

## 2021-12-22 HISTORY — DX: Functional dyspepsia: K30

## 2021-12-22 HISTORY — DX: Other psychoactive substance abuse, uncomplicated: F19.10

## 2021-12-22 LAB — POCT I-STAT, CHEM 8
BUN: 10 mg/dL (ref 8–23)
Calcium, Ion: 1.17 mmol/L (ref 1.15–1.40)
Chloride: 97 mmol/L — ABNORMAL LOW (ref 98–111)
Creatinine, Ser: 0.8 mg/dL (ref 0.61–1.24)
Glucose, Bld: 108 mg/dL — ABNORMAL HIGH (ref 70–99)
HCT: 44 % (ref 39.0–52.0)
Hemoglobin: 15 g/dL (ref 13.0–17.0)
Potassium: 3.8 mmol/L (ref 3.5–5.1)
Sodium: 138 mmol/L (ref 135–145)
TCO2: 27 mmol/L (ref 22–32)

## 2021-12-22 SURGERY — INSERTION, PENILE PROSTHESIS
Anesthesia: General | Site: Penis

## 2021-12-22 MED ORDER — KETOROLAC TROMETHAMINE 30 MG/ML IJ SOLN
INTRAMUSCULAR | Status: DC | PRN
  Administered 2021-12-22: 15 mg via INTRAVENOUS

## 2021-12-22 MED ORDER — FENTANYL CITRATE (PF) 100 MCG/2ML IJ SOLN
INTRAMUSCULAR | Status: AC
  Filled 2021-12-22: qty 2

## 2021-12-22 MED ORDER — ACETAMINOPHEN 325 MG PO TABS
650.0000 mg | ORAL_TABLET | ORAL | Status: DC
  Administered 2021-12-22 – 2021-12-23 (×2): 650 mg via ORAL

## 2021-12-22 MED ORDER — GABAPENTIN 100 MG PO CAPS
ORAL_CAPSULE | ORAL | Status: AC
  Filled 2021-12-22: qty 1

## 2021-12-22 MED ORDER — BUPRENORPHINE HCL-NALOXONE HCL 8-2 MG SL SUBL
1.0000 | SUBLINGUAL_TABLET | Freq: Every day | SUBLINGUAL | Status: DC
  Administered 2021-12-22: 1 via SUBLINGUAL
  Filled 2021-12-22: qty 1

## 2021-12-22 MED ORDER — IRRISEPT - 450ML BOTTLE WITH 0.05% CHG IN STERILE WATER, USP 99.95% OPTIME
TOPICAL | Status: DC | PRN
  Administered 2021-12-22: 900 mL via TOPICAL

## 2021-12-22 MED ORDER — VANCOMYCIN HCL IN DEXTROSE 1-5 GM/200ML-% IV SOLN
1000.0000 mg | Freq: Two times a day (BID) | INTRAVENOUS | Status: AC
  Administered 2021-12-22: 1000 mg via INTRAVENOUS

## 2021-12-22 MED ORDER — MIDAZOLAM HCL 2 MG/2ML IJ SOLN
INTRAMUSCULAR | Status: AC
  Filled 2021-12-22: qty 2

## 2021-12-22 MED ORDER — ONDANSETRON HCL 4 MG/2ML IJ SOLN
INTRAMUSCULAR | Status: DC | PRN
  Administered 2021-12-22: 4 mg via INTRAVENOUS

## 2021-12-22 MED ORDER — KETAMINE HCL 10 MG/ML IJ SOLN
INTRAMUSCULAR | Status: DC | PRN
  Administered 2021-12-22 (×4): 10 mg via INTRAVENOUS

## 2021-12-22 MED ORDER — LACTATED RINGERS IV SOLN: INTRAVENOUS | Status: DC

## 2021-12-22 MED ORDER — AMISULPRIDE (ANTIEMETIC) 5 MG/2ML IV SOLN: 10.0000 mg | Freq: Once | INTRAVENOUS | Status: DC | PRN

## 2021-12-22 MED ORDER — MIDAZOLAM HCL 5 MG/5ML IJ SOLN
INTRAMUSCULAR | Status: DC | PRN
  Administered 2021-12-22: 2 mg via INTRAVENOUS

## 2021-12-22 MED ORDER — BUPIVACAINE HCL 0.5 % IJ SOLN
INTRAMUSCULAR | Status: DC | PRN
  Administered 2021-12-22: 15 mL

## 2021-12-22 MED ORDER — PANTOPRAZOLE SODIUM 40 MG PO TBEC
80.0000 mg | DELAYED_RELEASE_TABLET | Freq: Every day | ORAL | Status: DC
  Administered 2021-12-22: 80 mg via ORAL

## 2021-12-22 MED ORDER — PROPOFOL 10 MG/ML IV BOLUS
INTRAVENOUS | Status: DC | PRN
  Administered 2021-12-22: 200 mg via INTRAVENOUS

## 2021-12-22 MED ORDER — ONDANSETRON HCL 4 MG/2ML IJ SOLN: 4.0000 mg | Freq: Once | INTRAMUSCULAR | Status: DC | PRN

## 2021-12-22 MED ORDER — GABAPENTIN 100 MG PO CAPS
100.0000 mg | ORAL_CAPSULE | Freq: Three times a day (TID) | ORAL | Status: DC
  Administered 2021-12-22 (×2): 100 mg via ORAL

## 2021-12-22 MED ORDER — OXYCODONE HCL 5 MG PO TABS
ORAL_TABLET | ORAL | Status: AC
  Filled 2021-12-22: qty 2

## 2021-12-22 MED ORDER — STERILE WATER FOR IRRIGATION IR SOLN
Status: DC | PRN
  Administered 2021-12-22: 500 mL

## 2021-12-22 MED ORDER — DEXAMETHASONE SODIUM PHOSPHATE 10 MG/ML IJ SOLN
INTRAMUSCULAR | Status: AC
  Filled 2021-12-22: qty 1

## 2021-12-22 MED ORDER — OXYCODONE HCL 5 MG PO TABS
10.0000 mg | ORAL_TABLET | Freq: Four times a day (QID) | ORAL | Status: DC | PRN
  Administered 2021-12-22 – 2021-12-23 (×4): 10 mg via ORAL

## 2021-12-22 MED ORDER — FAMOTIDINE 20 MG PO TABS
ORAL_TABLET | ORAL | Status: AC
  Filled 2021-12-22: qty 1

## 2021-12-22 MED ORDER — FENTANYL CITRATE (PF) 100 MCG/2ML IJ SOLN
INTRAMUSCULAR | Status: DC | PRN
  Administered 2021-12-22 (×2): 50 ug via INTRAVENOUS

## 2021-12-22 MED ORDER — CITALOPRAM HYDROBROMIDE 20 MG PO TABS
30.0000 mg | ORAL_TABLET | Freq: Every day | ORAL | Status: DC
  Administered 2021-12-22: 30 mg via ORAL
  Filled 2021-12-22: qty 1

## 2021-12-22 MED ORDER — GENTAMICIN IN SALINE 1.6-0.9 MG/ML-% IV SOLN
80.0000 mg | Freq: Once | INTRAVENOUS | Status: AC
  Administered 2021-12-22: 80 mg via INTRAVENOUS
  Filled 2021-12-22: qty 50

## 2021-12-22 MED ORDER — VANCOMYCIN HCL 500 MG IV SOLR
INTRAVENOUS | Status: AC
  Filled 2021-12-22: qty 10

## 2021-12-22 MED ORDER — ACETAMINOPHEN 500 MG PO TABS
1000.0000 mg | ORAL_TABLET | Freq: Once | ORAL | Status: AC
  Administered 2021-12-22: 1000 mg via ORAL

## 2021-12-22 MED ORDER — ACETAMINOPHEN 500 MG PO TABS
ORAL_TABLET | ORAL | Status: AC
  Filled 2021-12-22: qty 2

## 2021-12-22 MED ORDER — KETOROLAC TROMETHAMINE 15 MG/ML IJ SOLN: 15.0000 mg | Freq: Once | INTRAMUSCULAR | Status: DC | PRN

## 2021-12-22 MED ORDER — PROPOFOL 500 MG/50ML IV EMUL
INTRAVENOUS | Status: AC
  Filled 2021-12-22: qty 50

## 2021-12-22 MED ORDER — MENTHOL 3 MG MT LOZG: 1.0000 | LOZENGE | OROMUCOSAL | Status: DC | PRN

## 2021-12-22 MED ORDER — LIDOCAINE HCL (PF) 2 % IJ SOLN
INTRAMUSCULAR | Status: AC
  Filled 2021-12-22: qty 5

## 2021-12-22 MED ORDER — HYDROMORPHONE HCL 2 MG/ML IJ SOLN
INTRAMUSCULAR | Status: AC
  Filled 2021-12-22: qty 1

## 2021-12-22 MED ORDER — ONDANSETRON HCL 4 MG/2ML IJ SOLN
INTRAMUSCULAR | Status: AC
  Filled 2021-12-22: qty 2

## 2021-12-22 MED ORDER — PHENOL 1.4 % MT LIQD
1.0000 | OROMUCOSAL | Status: DC | PRN
  Administered 2021-12-22: 1 via OROMUCOSAL
  Filled 2021-12-22: qty 177

## 2021-12-22 MED ORDER — FAMOTIDINE 20 MG PO TABS
20.0000 mg | ORAL_TABLET | Freq: Every day | ORAL | Status: DC
  Administered 2021-12-22: 20 mg via ORAL

## 2021-12-22 MED ORDER — DEXMEDETOMIDINE HCL IN NACL 80 MCG/20ML IV SOLN
INTRAVENOUS | Status: AC
  Filled 2021-12-22: qty 20

## 2021-12-22 MED ORDER — SODIUM CHLORIDE 0.45 % IV SOLN: INTRAVENOUS | Status: DC

## 2021-12-22 MED ORDER — BUPRENORPHINE HCL-NALOXONE HCL 2-0.5 MG SL SUBL
2.0000 | SUBLINGUAL_TABLET | Freq: Every day | SUBLINGUAL | Status: DC
  Administered 2021-12-22: 2 via SUBLINGUAL
  Filled 2021-12-22: qty 2

## 2021-12-22 MED ORDER — CLONAZEPAM 0.5 MG PO TABS
0.5000 mg | ORAL_TABLET | Freq: Three times a day (TID) | ORAL | Status: DC | PRN
  Administered 2021-12-22 (×2): 0.5 mg via ORAL
  Filled 2021-12-22 (×2): qty 1

## 2021-12-22 MED ORDER — BENAZEPRIL HCL 10 MG PO TABS
10.0000 mg | ORAL_TABLET | Freq: Every day | ORAL | Status: DC
  Administered 2021-12-22: 10 mg via ORAL
  Filled 2021-12-22: qty 1

## 2021-12-22 MED ORDER — KETOROLAC TROMETHAMINE 15 MG/ML IJ SOLN
30.0000 mg | Freq: Three times a day (TID) | INTRAMUSCULAR | Status: AC
  Administered 2021-12-22 – 2021-12-23 (×2): 15 mg via INTRAVENOUS

## 2021-12-22 MED ORDER — PANTOPRAZOLE SODIUM 40 MG PO TBEC
DELAYED_RELEASE_TABLET | ORAL | Status: AC
  Filled 2021-12-22: qty 2

## 2021-12-22 MED ORDER — LIDOCAINE HCL 1 % IJ SOLN
INTRAMUSCULAR | Status: DC | PRN
  Administered 2021-12-22: 15 mL

## 2021-12-22 MED ORDER — GENTAMICIN SULFATE 40 MG/ML IJ SOLN
150.0000 mg | INTRAVENOUS | Status: AC
  Administered 2021-12-22: 150 mg via INTRAVENOUS
  Filled 2021-12-22: qty 3.75

## 2021-12-22 MED ORDER — KETOROLAC TROMETHAMINE 15 MG/ML IJ SOLN
INTRAMUSCULAR | Status: AC
  Filled 2021-12-22: qty 1

## 2021-12-22 MED ORDER — VANCOMYCIN HCL IN DEXTROSE 1-5 GM/200ML-% IV SOLN
INTRAVENOUS | Status: AC
  Filled 2021-12-22: qty 200

## 2021-12-22 MED ORDER — KETAMINE HCL 50 MG/5ML IJ SOSY
PREFILLED_SYRINGE | INTRAMUSCULAR | Status: AC
  Filled 2021-12-22: qty 5

## 2021-12-22 MED ORDER — ONDANSETRON HCL 4 MG/2ML IJ SOLN: 4.0000 mg | INTRAMUSCULAR | Status: DC | PRN

## 2021-12-22 MED ORDER — SODIUM CHLORIDE 0.9 % IV SOLN
INTRAVENOUS | Status: AC
  Filled 2021-12-22: qty 100

## 2021-12-22 MED ORDER — FENTANYL CITRATE (PF) 100 MCG/2ML IJ SOLN
25.0000 ug | INTRAMUSCULAR | Status: DC | PRN
  Administered 2021-12-22: 50 ug via INTRAVENOUS

## 2021-12-22 MED ORDER — ACETAMINOPHEN 325 MG PO TABS
ORAL_TABLET | ORAL | Status: AC
  Filled 2021-12-22: qty 2

## 2021-12-22 MED ORDER — MENTHOL 3 MG MT LOZG
LOZENGE | OROMUCOSAL | Status: AC
  Filled 2021-12-22: qty 9

## 2021-12-22 MED ORDER — CELECOXIB 200 MG PO CAPS
200.0000 mg | ORAL_CAPSULE | Freq: Two times a day (BID) | ORAL | Status: DC
  Administered 2021-12-22 – 2021-12-23 (×2): 200 mg via ORAL

## 2021-12-22 MED ORDER — MELATONIN 5 MG PO TABS: 5.0000 mg | ORAL_TABLET | Freq: Every evening | ORAL | Status: DC | PRN

## 2021-12-22 MED ORDER — VANCOMYCIN HCL 2000 MG/400ML IV SOLN
2000.0000 mg | INTRAVENOUS | Status: AC
  Administered 2021-12-22: 2000 mg via INTRAVENOUS
  Filled 2021-12-22: qty 400

## 2021-12-22 MED ORDER — DEXAMETHASONE SODIUM PHOSPHATE 10 MG/ML IJ SOLN
INTRAMUSCULAR | Status: DC | PRN
  Administered 2021-12-22: 10 mg via INTRAVENOUS

## 2021-12-22 MED ORDER — KETOROLAC TROMETHAMINE 30 MG/ML IJ SOLN
INTRAMUSCULAR | Status: AC
  Filled 2021-12-22: qty 1

## 2021-12-22 MED ORDER — CELECOXIB 200 MG PO CAPS
ORAL_CAPSULE | ORAL | Status: AC
  Filled 2021-12-22: qty 1

## 2021-12-22 MED ORDER — LIDOCAINE 2% (20 MG/ML) 5 ML SYRINGE
INTRAMUSCULAR | Status: DC | PRN
  Administered 2021-12-22: 60 mg via INTRAVENOUS

## 2021-12-22 SURGICAL SUPPLY — 62 items
ADH SKN CLS APL DERMABOND .7 (GAUZE/BANDAGES/DRESSINGS) ×1
APL PRP STRL LF DISP 70% ISPRP (MISCELLANEOUS) ×1
BAG DRN RND TRDRP ANRFLXCHMBR (UROLOGICAL SUPPLIES) ×1
BAG URINE DRAIN 2000ML AR STRL (UROLOGICAL SUPPLIES) ×1 IMPLANT
BIOPATCH BLUE 3/4IN DISK W/1.5 (GAUZE/BANDAGES/DRESSINGS) ×1 IMPLANT
BLADE SURG 11 STRL SS (BLADE) IMPLANT
BLADE SURG 15 STRL LF DISP TIS (BLADE) ×1 IMPLANT
BLADE SURG 15 STRL SS (BLADE) ×2
BNDG GAUZE DERMACEA FLUFF 4 (GAUZE/BANDAGES/DRESSINGS) ×1 IMPLANT
BNDG GZE DERMACEA 4 6PLY (GAUZE/BANDAGES/DRESSINGS) ×1
BRIEF MESH DISP LRG (UNDERPADS AND DIAPERS) ×1 IMPLANT
BRUSH SCRUB EZ PLAIN DRY (MISCELLANEOUS) IMPLANT
CATH COUDE 5CC RIBBED (CATHETERS) ×1 IMPLANT
CATH RIBBED COUDE 5CC (CATHETERS) ×1
CHLORAPREP W/TINT 26 (MISCELLANEOUS) ×1 IMPLANT
COVER BACK TABLE 60X90IN (DRAPES) ×1 IMPLANT
COVER MAYO STAND STRL (DRAPES) ×2 IMPLANT
DERMABOND ADVANCED .7 DNX12 (GAUZE/BANDAGES/DRESSINGS) ×1 IMPLANT
DRAIN CHANNEL 10F 3/8 F FF (DRAIN) ×1 IMPLANT
DRAPE INCISE IOBAN 66X45 STRL (DRAPES) ×1 IMPLANT
DRAPE LAPAROTOMY 100X72 PEDS (DRAPES) ×1 IMPLANT
DRAPE UTILITY XL STRL (DRAPES) IMPLANT
EVACUATOR DRAINAGE 7X20 100CC (MISCELLANEOUS) IMPLANT
EVACUATOR SILICONE 100CC (MISCELLANEOUS) ×1
GAUZE 4X4 16PLY ~~LOC~~+RFID DBL (SPONGE) ×1 IMPLANT
GAUZE SPONGE 4X4 12PLY STRL (GAUZE/BANDAGES/DRESSINGS) ×1 IMPLANT
GLOVE BIO SURGEON STRL SZ 6.5 (GLOVE) IMPLANT
GLOVE BIO SURGEON STRL SZ7 (GLOVE) ×1 IMPLANT
GLOVE BIO SURGEON STRL SZ7.5 (GLOVE) ×3 IMPLANT
GLOVE BIOGEL PI IND STRL 6 (GLOVE) IMPLANT
GLOVE BIOGEL PI IND STRL 7.0 (GLOVE) IMPLANT
GLOVE ECLIPSE 6.0 STRL STRAW (GLOVE) IMPLANT
GOWN STRL REUS W/TWL LRG LVL3 (GOWN DISPOSABLE) ×1 IMPLANT
HIBICLENS CHG 4% 4OZ (MISCELLANEOUS) IMPLANT
HOLDER FOLEY CATH W/STRAP (MISCELLANEOUS) ×1 IMPLANT
JET LAVAGE IRRISEPT WOUND (IRRIGATION / IRRIGATOR) ×2
KIT TURNOVER CYSTO (KITS) ×1 IMPLANT
LAVAGE JET IRRISEPT WOUND (IRRIGATION / IRRIGATOR) IMPLANT
MANIFOLD NEPTUNE II (INSTRUMENTS) IMPLANT
NEEDLE HYPO 22GX1.5 SAFETY (NEEDLE) IMPLANT
PACK BASIN DAY SURGERY FS (CUSTOM PROCEDURE TRAY) ×1 IMPLANT
PENCIL SMOKE EVACUATOR (MISCELLANEOUS) ×1 IMPLANT
PLUG CATH AND CAP STER (CATHETERS) ×1 IMPLANT
PROSTH PENILE TACTRA 11X16X25 (Miscellaneous) ×1 IMPLANT
PROSTHESIS PENILE TACTRA 11X16 (Miscellaneous) ×1 IMPLANT
PROSTHESIS PNL TACTRA 11X16X25 (Miscellaneous) IMPLANT
RETRACTOR DEEP SCROTAL PENILE (MISCELLANEOUS) IMPLANT
SUPPORTER ATHLETIC XL (MISCELLANEOUS) ×1
SUPPORTER ATHLETIC XL 3X44-50X (MISCELLANEOUS) IMPLANT
SUT MON AB 3-0 SH 27 (SUTURE) ×4
SUT MON AB 3-0 SH27 (SUTURE) ×4 IMPLANT
SUT VIC AB 2-0 UR6 27 (SUTURE) ×2 IMPLANT
SUT VIC AB 3-0 SH 27 (SUTURE) ×1
SUT VIC AB 3-0 SH 27X BRD (SUTURE) IMPLANT
SYR 10ML LL (SYRINGE) ×3 IMPLANT
SYR 50ML LL SCALE MARK (SYRINGE) ×2 IMPLANT
SYR BULB IRRIG 60ML STRL (SYRINGE) ×1 IMPLANT
TOWEL OR 17X26 10 PK STRL BLUE (TOWEL DISPOSABLE) ×2 IMPLANT
TRAY DSU PREP LF (CUSTOM PROCEDURE TRAY) ×1 IMPLANT
TUBE CONNECTING 12X1/4 (SUCTIONS) ×1 IMPLANT
WATER STERILE IRR 500ML POUR (IV SOLUTION) IMPLANT
YANKAUER SUCT BULB TIP NO VENT (SUCTIONS) ×2 IMPLANT

## 2021-12-22 NOTE — Anesthesia Procedure Notes (Signed)
Procedure Name: LMA Insertion Date/Time: 12/22/2021 10:06 AM  Performed by: Bonney Aid, CRNAPre-anesthesia Checklist: Patient identified, Emergency Drugs available, Suction available and Patient being monitored Patient Re-evaluated:Patient Re-evaluated prior to induction Oxygen Delivery Method: Circle system utilized Preoxygenation: Pre-oxygenation with 100% oxygen Induction Type: IV induction Ventilation: Mask ventilation without difficulty LMA: LMA inserted LMA Size: 5.0 Number of attempts: 1 Airway Equipment and Method: Bite block Placement Confirmation: positive ETCO2 Tube secured with: Tape Dental Injury: Teeth and Oropharynx as per pre-operative assessment

## 2021-12-22 NOTE — Op Note (Signed)
PATIENT:  Tyler Coleman  PRE-OPERATIVE DIAGNOSIS:  Organic erectile dysfunction  POST-OPERATIVE DIAGNOSIS:  Same  PROCEDURE:  Procedure(s): insertion of malleable penile prosthesis (BS/AMS)  SURGEON:  Donald Pore MD  ASST: Tommie Sams, MD  INDICATION: He has had long-standing organic erectile dysfunction and refractory to other modes of treatment. He has elected to proceed with prosthesis implantation.  ANESTHESIA:  General  EBL:  Minimal  Device: malleable AMS prosthesis (Tactra) and 0.5 cm rear-tip extender on right, 0 cm RTE on the L  LOCAL MEDICATIONS USED:  None  SPECIMEN: None  DISPOSITION OF SPECIMEN:  N/A  Description of procedure: The patient was taken to the major operating room, placed on the table and administered general anesthesia in the supine position. His genitalia was then prepped with chlorhexidine x 2. He was draped in the usual sterile fashion, and I used [place] on the field. An official timeout was then performed.  A 14 French coude catheter was then placed in the bladder and the bladder was drained and the catheter was plugged. A midline penoscrotal incision was then made and the dissection was carried down to the corpora and urethra. The lonestar retractor was positioned so as to have excellent exposure. 2-0 Vicryl sutures were then placed proximally in each corpus cavernosum to serve as stay sutures. An incision was then made in the corpus cavernosum first on the left-hand side with the bovie. Tru[place] were used to gently dilate the opening. I then dilated the corpus cavernosum with the a 11 Fr brooks dilator distally and proximally. Field goal post tests were performed and there was no evidence of perforations or crossover. I then irrigated the corpus cavernosum with antibiotic solution and measured the distance proximally and distally from the stay suture and was found to be 10 and 10.5 cm, respectively.I then turned my attention to the contralateral corpus  cavernosum and placed my stay sutures, made my corporotomy and dilated the corpus cavernosum in an identical fashion. This was measured and also was found to be 11 cm proximally and 10 distally. It was irrigated with anastomotic solution as was the scrotum. The malleable was cut to 20 cm and a 0.5 cm RTE was added to the R  The malleable rod was placed on each side. There was no evidence of proximal/distal crossover. The stay sutures were closed.  I noted a good straight erection with both cylinders equidistant under the glans.  I irrigated the wound one last time with antibiotic irrigation and then closed the deep scrotal tissue with running 3-0 vicryl suture. I placed a 10 Fr blake drain over the corporotomies. A second layer was then closed over this first layer with running 3- 0 vicryl, and running skin suture w/ 4-0 monocryl performed. Incision dressed with dermabond.  A mummy wrap was applied. The catheter was connected to closed system drainage, and drain connected to suction bulb and the patient was awakened and taken recovery room in stable and satisfactory condition. He tolerated the procedure well and there were no intraoperative complications. Needle sponge and instrument counts were correct at the end of the operation.

## 2021-12-22 NOTE — Discharge Instructions (Signed)
Penile prosthesis postoperative instructions  Wound:  In most cases your incision will have absorbable sutures that will dissolve within the first 10-20 days. Some will fall out even earlier. Expect some redness as the sutures dissolved but this should occur only around the sutures. If there is generalized redness, especially with increasing pain or swelling, let us know. The scrotum and penis will very likely get "black and blue" as the blood in the tissues spread. Sometimes the whole scrotum will turn colors. The black and blue is followed by a yellow and brown color. In time, all the discoloration will go away. In some cases some firm swelling in the area of the testicle and pump may persist for up to 4-6 weeks after the surgery and is considered normal in most cases.  Drain:   You may be discharged home with a drain in place. If so, you will be taught how to empty it and should keep track of the output. Additionally, you should call the office to arrange for an appointment to have it removed after a few days.   Diet:  You may return to your normal diet within 24 hours following your surgery. You may note some mild nausea and possibly vomiting the first 6-8 hours following surgery. This is usually due to the side effects of anesthesia, and will disappear quite soon. I would suggest clear liquids and a very light meal the first evening following your surgery.  Activity:  Your physical activity should be restricted the first 48 hours. During that time you should remain relatively inactive, moving about only when necessary. During the first 3 weeks following surgery you should avoid lifting any heavy objects (anything greater than 15 pounds), and avoid strenuous exercise. If you work, ask [place] specifically about your restrictions, both for work and home. We will write a note to your employer if needed.  Avoid using your penis until your follow up visit with Dr Cain Sieve, which will typically be around  3-4 weeks following the surgery. Most people are able to start using their device after that appointment, and can have intercourse soon thereafter.   You should plan to wear a tight pair of jockey shorts or an athletic supporter for the first 4-5 days, even to sleep. This will keep the scrotum immobilized to some degree and keep the swelling down.The position of your penis will determine what is most comfortable but I strongly urge you to keep the penis in the "up" position (toward your head). You should continue to tuck "up" your penis when possible for the first 3 months following surgery.  Ice packs should be placed on and off over the scrotum for the first 48 hours. Frozen peas or corn in a ZipLock bag can be frozen, used and re-frozen. Fifteen minutes on and 15 minutes off is a reasonable schedule. The ice is a good pain reliever and keeps the swelling down.  Hygiene:  You may shower 48 hours after your surgery. Tub bathing should be restricted until the wound is completely healed, typically around 2-3 weeks.  Medication:  You will be sent home with some type of pain medication. In many cases you will be sent home with a strong anti-inflammatory medication (Celebrex, Meloxicam) and a narcotic pain pill (hydrocodone or oxycodone). You should also supplement these medications with tylenol (acetaminophen). If the pain medication you are sent home with does not control the pain, please notify the office Problems you should report to [place]:  Fever of 101.0 degrees  Fahrenheit or greater. Moderate or severe swelling under the skin incision or involving the scrotum. Drug reaction such as hives, a rash, nausea or vomiting.  

## 2021-12-22 NOTE — H&P (Signed)
H&P  History of Present Illness: Tyler Coleman is a 67 y.o. year old with refractory ED who presents today for implantation of a malleable penile prosthesis  No acute complaints  Past Medical History:  Diagnosis Date   Alcohol use disorder, moderate, dependence ([place])    per pcp note in epic    (12-09-2021  pt stated last drank alcohol 6 months ago)   Anxiety    Arthritis    Complication of anesthesia    12-09-2021  pt stated with ankle surgery in 1978 "my lung froze and could not breath"  told it was a medication that is no longer used)   DDD (degenerative disc disease), lumbar    Delayed gastric emptying    Depression    Drug induced constipation    chronic   ED (erectile dysfunction)    Esophageal achalasia    DG esophagus imaging 09-24-2021--  seen by surgeon dr Kieth Brightly, pt is scheduled for esophageal manometry 03-23-2022   GERD (gastroesophageal reflux disease)    Hypertension    Hypogonadism in male    Mixed hyperlipidemia    Opioid dependence on agonist therapy ([place])    followed by pcp---   twice daily suboxone   Polysubstance abuse ([place])    cocaince, heroin, fentanyl, benzodiazepines, opiate addiction  (12-09-2021  pt stated stopped at age 74 but every 10 yrs ago he binge's,  stated last drug use was heroin IV narcon used by EMS (ED visit in epic)    Past Surgical History:  Procedure Laterality Date   ANKLE SURGERY Right 1978   per pt for fracture, has retained hardware   COLONOSCOPY WITH PROPOFOL  06/22/2021   by dr Silverio Decamp   ESOPHAGOGASTRODUODENOSCOPY (EGD) WITH ESOPHAGEAL DILATION  2020   [place];  including L4-5 lamenctomy   NASAL SINUS SURGERY     1990s   SHOULDER SURGERY Bilateral    1995 and 2000    Home Medications:  Current Meds  Medication Sig   benazepril (LOTENSIN) 10 MG tablet Take 10 mg by mouth daily.   Buprenorphine HCl-Naloxone HCl (SUBOXONE) 12-3 MG FILM Place under the tongue in the morning and at  bedtime. Per pcp note pt prescribed BID ,  however pt take one cuts it up and takes different times through the day   citalopram (CELEXA) 20 MG tablet Take 30 mg by mouth daily.   clonazePAM (KLONOPIN) 0.5 MG tablet Take 0.5 mg by mouth 3 (three) times daily as needed for anxiety. Per pt's pcp note this is prescribed TID prn however pt takes TID daily   famotidine (PEPCID) 20 MG tablet Take 20 mg by mouth daily.   fluticasone (FLONASE) 50 MCG/ACT nasal spray Place 1 spray into both nostrils daily as needed.   MELATONIN PO Take by mouth at bedtime as needed.   mupirocin ointment (BACTROBAN) 2 % 1 Application as needed. Per pt has area on leg that itching at times   OVER THE COUNTER MEDICATION Take 1 capsule by mouth 3 (three) times daily. GOLO diet supplement, pt aware to stop today 12-09-2021 prior to surgery on 12-22-2021   pantoprazole (PROTONIX) 40 MG tablet Take 80 mg by mouth daily.   Polyethylene Glycol 3350 (MIRALAX PO) Take by mouth every other day. Per pt alternates between miralax and metamucil , takes at night   psyllium (METAMUCIL) 58.6 % powder Take 1 packet by mouth every other day. Per pt alternates w/ miralax ,  takes at night   sodium chloride (OCEAN) 0.65 % SOLN nasal spray Place 1 spray into both nostrils as needed for congestion.   testosterone cypionate (DEPO-TESTOSTERONE) 200 MG/ML injection Inject into the muscle every 14 (fourteen) days.    Allergies:  Allergies  Allergen Reactions   Augmentin [Amoxicillin-Pot Clavulanate] Other (See Comments)    Per pt severe burning of throat , "it got stuck" does not want it again   Doxycycline     Unknown reaction per pcp note   Erythromycin     Unknown reaction per pcp note   Thorazine [Chlorpromazine] Swelling    "My throat swelled"    Family History  Problem Relation Age of Onset   Heart failure Mother    Heart failure Father    Mental illness Neg Hx     Social History:  reports that he quit smoking about 39 years ago.  His smoking use included cigarettes. He has never used smokeless tobacco. He reports that he does not currently use alcohol. He reports that he does not currently use drugs after having used the following drugs: Cocaine, Heroin, Other-see comments, Fentanyl, and Benzodiazepines.  ROS: A complete review of systems was performed.  All systems are negative except for pertinent findings as noted.  Physical Exam:  Vital signs in last 24 hours: Temp:  [97.7 F (36.5 C)] 97.7 F (36.5 C) (10/04 0754) Pulse Rate:  [69] 69 (10/04 0754) Resp:  [17] 17 (10/04 0754) BP: (134)/(74) 134/74 (10/04 0754) SpO2:  [96 %] 96 % (10/04 0754) Weight:  [105.5 kg] 105.5 kg (10/04 0754) Constitutional:  Alert and oriented, No acute distress Cardiovascular: Regular rate and rhythm, No JVD Respiratory: Normal respiratory effort, Lungs clear bilaterally GI: Abdomen is soft, nontender, nondistended, no abdominal masses GU: No CVA tenderness Lymphatic: No lymphadenopathy Neurologic: Grossly intact, no focal deficits Psychiatric: Normal mood and affect Drains/Tubes: none   Laboratory Data:  Recent Labs    12/22/21 0751  HGB 15.0  HCT 44.0    Recent Labs    12/22/21 0751  NA 138  K 3.8  CL 97*  GLUCOSE 108*  BUN 10  CREATININE 0.80     Results for orders placed or performed during the hospital encounter of 12/22/21 (from the past 24 hour(s))  I-STAT, chem 8     Status: Abnormal   Collection Time: 12/22/21  7:51 AM  Result Value Ref Range   Sodium 138 135 - 145 mmol/L   Potassium 3.8 3.5 - 5.1 mmol/L   Chloride 97 (L) 98 - 111 mmol/L   BUN 10 8 - 23 mg/dL   Creatinine, Ser 4.31 0.61 - 1.24 mg/dL   Glucose, Bld 540 (H) 70 - 99 mg/dL   Calcium, Ion 0.86 7.61 - 1.40 mmol/L   TCO2 27 22 - 32 mmol/L   Hemoglobin 15.0 13.0 - 17.0 g/dL   HCT 95.0 93.2 - 67.1 %   No results found for this or any previous visit (from the past 240 hour(s)).  Renal Function: Recent Labs    12/22/21 0751   CREATININE 0.80   Estimated Creatinine Clearance: 111 mL/min (by C-G formula based on SCr of 0.8 mg/dL).  Radiologic Imaging: No results found.  Assessment:  66 yo M with refractory ED  Plan:  To OR for implantation of malleable prosthesis as planned. We reviewed risks including but not limited to bleeding, hematoma, infection, implant infection, damage to adjacent structures (urethra), pain, device malfunction. We discussed the implant will likely be  somewhat shorter than his "natural" erection. We also discussed the pros/cons of a malleable implant compared to an inflatable implant and he voiced understanding.   Irine Seal, MD 12/22/2021, 8:30 AM  [facility] Pager: [phone]

## 2021-12-22 NOTE — Transfer of Care (Signed)
Immediate Anesthesia Transfer of Care Note  Patient: Tyler Coleman  Procedure(s) Performed: INSERTION OF MALLEABLE PENILE PROSTHESIS (Penis)  Patient Location: PACU  Anesthesia Type:General  Level of Consciousness: drowsy  Airway & Oxygen Therapy: Patient Spontanous Breathing and Patient connected to nasal cannula oxygen  Post-op Assessment: Report given to RN  Post vital signs: Reviewed and stable  Last Vitals:  Vitals Value Taken Time  BP 147/81 12/22/21 1200  Temp    Pulse 71 12/22/21 1203  Resp 15 12/22/21 1203  SpO2 96 % 12/22/21 1203  Vitals shown include unvalidated device data.  Last Pain:  Vitals:   12/22/21 0754  TempSrc: Oral  PainSc: 0-No pain      Patients Stated Pain Goal: 2 (84/66/59 [id number])  Complications: No notable events documented.

## 2021-12-22 NOTE — Anesthesia Postprocedure Evaluation (Signed)
Anesthesia Post Note  Patient: Tyler Coleman  Procedure(s) Performed: INSERTION OF MALLEABLE PENILE PROSTHESIS (Penis)     Patient location during evaluation: PACU Anesthesia Type: General Level of consciousness: awake Pain management: pain level controlled Vital Signs Assessment: post-procedure vital signs reviewed and stable Respiratory status: spontaneous breathing, nonlabored ventilation, respiratory function stable and patient connected to nasal cannula oxygen Cardiovascular status: blood pressure returned to baseline and stable Postop Assessment: no apparent nausea or vomiting Anesthetic complications: no   No notable events documented.  Last Vitals:  Vitals:   12/22/21 1300 12/22/21 1305  BP: 134/64 (!) 141/77  Pulse: 60 67  Resp: (!) 9 17  Temp: (!) 36.4 C (!) 36.3 C  SpO2: 91% 95%    Last Pain:  Vitals:   12/22/21 1410  TempSrc:   PainSc: Asleep                 Warnie Belair P Kirsi Hugh

## 2021-12-23 ENCOUNTER — Encounter (HOSPITAL_BASED_OUTPATIENT_CLINIC_OR_DEPARTMENT_OTHER): Payer: Self-pay | Admitting: Urology

## 2021-12-23 DIAGNOSIS — N529 Male erectile dysfunction, unspecified: Secondary | ICD-10-CM | POA: Diagnosis not present

## 2021-12-23 MED ORDER — CELECOXIB 200 MG PO CAPS
ORAL_CAPSULE | ORAL | Status: AC
  Filled 2021-12-23: qty 1

## 2021-12-23 MED ORDER — ACETAMINOPHEN 325 MG PO TABS: 650.0000 mg | ORAL_TABLET | ORAL | 1 refills | Status: DC

## 2021-12-23 MED ORDER — SULFAMETHOXAZOLE-TRIMETHOPRIM 800-160 MG PO TABS: 1.0000 | ORAL_TABLET | Freq: Two times a day (BID) | ORAL | 0 refills | Status: AC

## 2021-12-23 MED ORDER — OXYCODONE HCL 10 MG PO TABS: 10.0000 mg | ORAL_TABLET | Freq: Four times a day (QID) | ORAL | 0 refills | Status: DC | PRN

## 2021-12-23 MED ORDER — CELECOXIB 200 MG PO CAPS: 200.0000 mg | ORAL_CAPSULE | Freq: Two times a day (BID) | ORAL | 1 refills | Status: DC

## 2021-12-23 MED ORDER — ACETAMINOPHEN 325 MG PO TABS
ORAL_TABLET | ORAL | Status: AC
  Filled 2021-12-23: qty 2

## 2021-12-23 MED ORDER — OXYCODONE HCL 5 MG PO TABS
ORAL_TABLET | ORAL | Status: AC
  Filled 2021-12-23: qty 2

## 2021-12-23 MED ORDER — KETOROLAC TROMETHAMINE 15 MG/ML IJ SOLN
INTRAMUSCULAR | Status: AC
  Filled 2021-12-23: qty 1

## 2021-12-23 NOTE — Discharge Summary (Signed)
[facility] Discharge Summary  Admit date: 12/22/2021  Discharge date and time: 12/23/21   Discharge to: Home  Discharge Service: Urology  Discharge Attending Physician:  Dr. Traci Sermon, MD  Discharge  Diagnoses: S/P insertion of penile implant  Secondary Diagnosis: Principal Problem:   S/P insertion of penile implant   OR Procedures: Procedure(s): INSERTION OF MALLEABLE PENILE PROSTHESIS 12/22/2021   Ancillary Procedures: None   Discharge Day Services: The patient was seen and examined by the Urology team both in the morning and immediately prior to discharge.  Vital signs and laboratory values were stable and within normal limits.  The physical exam was benign and unchanged and all surgical wounds were examined.  Discharge instructions were explained and all questions answered.  Subjective  No acute events overnight. Pain Controlled. No fever or chills.  Objective Patient Vitals for the past 8 hrs:  BP Temp Pulse Resp SpO2  12/23/21 0609 130/67 97.6 F (36.4 C) 77 14 96 %  12/23/21 0220 119/71 97.6 F (36.4 C) 62 12 93 %   No intake/output data recorded.  General Appearance:        No acute distress Lungs:                       Normal work of breathing on room air Heart:                                Regular rate Abdomen:                         Soft, non-tender, non-distended GU:         Foley catheter in place with clear yellow urine in tubing, male phallus with penile prosthesis in place, appropriately tender to palpation, JP w/ serosanguinous drainage Extremities:                      Warm and well perfused   Hospital Course:  The patient is a 66 year old male with long-standing erectile dysfunction refractory to other treatments.   The patient underwent Insertion of malleable penile prosthesis on 12/22/2021.  The patient tolerated the procedure well, was extubated in the OR, and afterwards was taken to the PACU for routine post-surgical care. When stable  the patient was transferred to the floor.   The patient did well postoperatively.  The patient's diet was slowly advanced and at the time of discharge was tolerating a regular diet.  The patient was discharged home 1 Day Post-Op, at which point was tolerating a regular solid diet, was able to void spontaneously, have adequate pain control with P.O. pain medication, and could ambulate without difficulty. JP drain was removed prior to discharge. The patient will follow up with [place] for post op check.   Condition at Discharge: Improved  Discharge Medications:  Allergies as of 12/23/2021       Reactions   Augmentin [amoxicillin-pot Clavulanate] Other (See Comments)   Per pt severe burning of throat , "it got stuck" does not want it again   Doxycycline    Unknown reaction per pcp note   Erythromycin    Unknown reaction per pcp note   Thorazine [chlorpromazine] Swelling   "My throat swelled"        Medication List     TAKE these medications    acetaminophen 325 MG tablet Commonly known as:  TYLENOL Take 2 tablets (650 mg total) by mouth every 4 (four) hours.   benazepril 10 MG tablet Commonly known as: LOTENSIN Take 10 mg by mouth daily.   celecoxib 200 MG capsule Commonly known as: CELEBREX Take 1 capsule (200 mg total) by mouth 2 (two) times daily.   citalopram 20 MG tablet Commonly known as: CELEXA Take 30 mg by mouth daily.   clonazePAM 0.5 MG tablet Commonly known as: KLONOPIN Take 0.5 mg by mouth 3 (three) times daily as needed for anxiety. Per pt's pcp note this is prescribed TID prn however pt takes TID daily   Depo-Testosterone 200 MG/ML injection Generic drug: testosterone cypionate Inject into the muscle every 14 (fourteen) days.   famotidine 20 MG tablet Commonly known as: PEPCID Take 20 mg by mouth daily.   fluticasone 50 MCG/ACT nasal spray Commonly known as: FLONASE Place 1 spray into both nostrils daily as needed.   Linzess 145 MCG Caps capsule Generic  drug: linaclotide Take 145 mcg by mouth daily before breakfast.   MELATONIN PO Take by mouth at bedtime as needed.   MIRALAX PO Take by mouth every other day. Per pt alternates between miralax and metamucil , takes at night   mupirocin ointment 2 % Commonly known as: BACTROBAN 1 Application as needed. Per pt has area on leg that itching at times   OVER THE COUNTER MEDICATION Take 1 capsule by mouth 3 (three) times daily. GOLO diet supplement, pt aware to stop today 12-09-2021 prior to surgery on 12-22-2021   Oxycodone HCl 10 MG Tabs Take 1 tablet (10 mg total) by mouth every 6 (six) hours as needed for breakthrough pain.   pantoprazole 40 MG tablet Commonly known as: PROTONIX Take 80 mg by mouth daily.   psyllium 58.6 % powder Commonly known as: METAMUCIL Take 1 packet by mouth every other day. Per pt alternates w/ miralax , takes at night   sodium chloride 0.65 % Soln nasal spray Commonly known as: OCEAN Place 1 spray into both nostrils as needed for congestion.   Suboxone 12-3 MG Film Generic drug: Buprenorphine HCl-Naloxone HCl Place under the tongue in the morning and at bedtime. Per pcp note pt prescribed BID ,  however pt take one cuts it up and takes different times through the day

## 2021-12-24 ENCOUNTER — Telehealth: Payer: Self-pay | Admitting: Gastroenterology

## 2021-12-24 NOTE — Telephone Encounter (Signed)
Patient called states he would like to s[eak to the nurse regarding a test he was supposed to do at [facility]. Requesting a call back as soon as possible

## 2021-12-27 ENCOUNTER — Telehealth: Payer: Self-pay | Admitting: Gastroenterology

## 2021-12-27 NOTE — Telephone Encounter (Signed)
Inbound call from patient stating he did not want to do his procedure at [facility], he wanted it done at [facility]. I advised him our providers do not do procedures at [facility]. He said we "set it up" so that's where he wants it done. Please advise.

## 2021-12-27 NOTE — Telephone Encounter (Signed)
Called patient back. He has left the house. The male answering the phone will tell him of my call. Asked he call back and ask to speak with me.

## 2021-12-27 NOTE — Telephone Encounter (Signed)
Called patient back. He has left the house. The male answering the phone will tell him of my call. Asked he call back and ask to speak with me. 

## 2021-12-30 NOTE — Telephone Encounter (Signed)
Patient was referred to [place] from [facility] for an esophageal manometry. Patient has not returned my call. I have left a message for the referral coordinator at [place] her of the issue.  I have not canceled the manometry he is scheduled for at [facility] on 01/05/22.

## 2022-01-05 ENCOUNTER — Ambulatory Visit (HOSPITAL_COMMUNITY)
Admission: RE | Admit: 2022-01-05 | Discharge: 2022-01-05 | Disposition: A | Discharge status: Home / Self Care | Payer: Medicare Other | Source: Ambulatory Visit | Attending: Gastroenterology | Admitting: Gastroenterology

## 2022-01-05 ENCOUNTER — Encounter (HOSPITAL_COMMUNITY): Payer: Self-pay | Admitting: Gastroenterology

## 2022-01-05 ENCOUNTER — Encounter (HOSPITAL_COMMUNITY)
Admission: RE | Disposition: A | Discharge status: Home / Self Care | Payer: Self-pay | Source: Ambulatory Visit | Attending: Gastroenterology

## 2022-01-05 DIAGNOSIS — R1310 Dysphagia, unspecified: Secondary | ICD-10-CM | POA: Diagnosis not present

## 2022-01-05 DIAGNOSIS — R1110 Vomiting, unspecified: Secondary | ICD-10-CM | POA: Insufficient documentation

## 2022-01-05 DIAGNOSIS — K220 Achalasia of cardia: Secondary | ICD-10-CM | POA: Diagnosis not present

## 2022-01-05 DIAGNOSIS — K222 Esophageal obstruction: Secondary | ICD-10-CM | POA: Insufficient documentation

## 2022-01-05 HISTORY — PX: ESOPHAGEAL MANOMETRY: SHX5429

## 2022-01-05 SURGERY — MANOMETRY, ESOPHAGUS
Anesthesia: Choice

## 2022-01-05 MED ORDER — LIDOCAINE VISCOUS HCL 2 % MT SOLN
OROMUCOSAL | Status: AC
  Filled 2022-01-05: qty 15

## 2022-01-05 SURGICAL SUPPLY — 2 items
FACESHIELD LNG OPTICON STERILE (SAFETY) IMPLANT
GLOVE BIO SURGEON STRL SZ8 (GLOVE) ×2 IMPLANT

## 2022-01-05 NOTE — Progress Notes (Signed)
Esophageal manometry performed per protocol without complications.  Patient tolerated well. 

## 2022-01-09 ENCOUNTER — Encounter (HOSPITAL_COMMUNITY): Payer: Self-pay | Admitting: Gastroenterology

## 2022-01-18 DIAGNOSIS — K220 Achalasia of cardia: Secondary | ICD-10-CM

## 2022-01-18 DIAGNOSIS — R1310 Dysphagia, unspecified: Secondary | ICD-10-CM

## 2022-02-08 ENCOUNTER — Emergency Department (HOSPITAL_COMMUNITY)
Admission: EM | Admit: 2022-02-08 | Discharge: 2022-02-08 | Disposition: A | Discharge status: Home / Self Care | Payer: Medicare Other | Attending: Emergency Medicine | Admitting: Emergency Medicine

## 2022-02-08 ENCOUNTER — Emergency Department (HOSPITAL_COMMUNITY): Payer: Medicare Other

## 2022-02-08 DIAGNOSIS — S2242XD Multiple fractures of ribs, left side, subsequent encounter for fracture with routine healing: Secondary | ICD-10-CM | POA: Diagnosis not present

## 2022-02-08 DIAGNOSIS — I10 Essential (primary) hypertension: Secondary | ICD-10-CM | POA: Insufficient documentation

## 2022-02-08 DIAGNOSIS — X58XXXD Exposure to other specified factors, subsequent encounter: Secondary | ICD-10-CM | POA: Diagnosis not present

## 2022-02-08 MED ORDER — KETOROLAC TROMETHAMINE 15 MG/ML IJ SOLN
15.0000 mg | Freq: Once | INTRAMUSCULAR | Status: AC
  Administered 2022-02-08: 15 mg via INTRAMUSCULAR
  Filled 2022-02-08: qty 1

## 2022-02-08 MED ORDER — HYDRALAZINE HCL 20 MG/ML IJ SOLN
10.0000 mg | Freq: Once | INTRAMUSCULAR | Status: AC
  Administered 2022-02-08: 10 mg via INTRAVENOUS
  Filled 2022-02-08: qty 1

## 2022-02-08 MED ORDER — BENAZEPRIL HCL 10 MG PO TABS
10.0000 mg | ORAL_TABLET | Freq: Once | ORAL | Status: AC
  Administered 2022-02-08: 10 mg via ORAL
  Filled 2022-02-08: qty 1

## 2022-02-08 NOTE — Discharge Instructions (Addendum)
Your work-up and exam today was reassuring.  We did notice the previously known rib fractures that you stated you suffered about 5 or 6 weeks ago.  You got a Toradol shot in the emergency room for this.  Continue taking Tylenol and Motrin.  You can do 1000 mg of Tylenol every 8 hours.  You can do Motrin 600 or 800 mg every 6-8 hours.  Your blood pressure and headache did improve while you are here.  He did get your home dose of Lotensin, and a dose of hydralazine.  Your blood pressure improved to 140/78.  For any concerning symptoms such as chest pain, shortness of breath, balance issues, vision change please return to the emergency room otherwise follow-up with your primary care provider.  I recommend you keep a blood pressure diary until you follow-up with your primary care provider.

## 2022-02-08 NOTE — ED Triage Notes (Addendum)
Ems brings pt in from home for high blood pressure. Pt reports he "snorted some ice" around 6pm yesterday. Pt reports headache today.

## 2022-02-08 NOTE — ED Provider Notes (Signed)
[facility]-EMERGENCY DEPT Provider Note   CSN: [id number] Arrival date & time: 02/08/22  3762     History  Chief Complaint  Patient presents with   Hypertension    Tyler Coleman is a 66 y.o. male.  66 year old male with past medical history that is significant for hypertension, achalasia, polysubstance abuse who presents today for evaluation of elevated blood pressure, headache.  He states yesterday he took some meth to help with his pain from rib fractures that he suffered about 1 month ago.  States he fell into the bathroom striking his left side of the chest wall in the bathtub.  Denies head injury or loss of consciousness.  States his headache has improved since arriving.  He states he does not routinely use drugs.  He states prior to last night last time he used was 40 years ago.  Does appear anxious on exam.  Denies any new chest pain, shortness of breath, vision change, lightheadedness, balance issues since yesterday.  Does report compliance with his antihypertensive most recent dose was yesterday.  He mentions he does follow with somebody and has seen them a few times since having the reported rib fractures.  He is unable to qualify if this is his primary care provider or even a healthcare professional.  He does state that he had x-ray done which did confirm the fractures.  The history is provided by the patient. No language interpreter was used.       Home Medications Prior to Admission medications   Medication Sig Start Date End Date Taking? Authorizing Provider  acetaminophen (TYLENOL) 325 MG tablet Take 2 tablets (650 mg total) by mouth every 4 (four) hours. 12/23/21   Despina Arias, MD  benazepril (LOTENSIN) 10 MG tablet Take 10 mg by mouth daily.    [provider]  Buprenorphine HCl-Naloxone HCl (SUBOXONE) 12-3 MG FILM Place under the tongue in the morning and at bedtime. Per pcp note pt prescribed BID ,  however pt take one cuts it  up and takes different times through the day    [provider]  celecoxib (CELEBREX) 200 MG capsule Take 1 capsule (200 mg total) by mouth 2 (two) times daily. 12/23/21   Despina Arias, MD  citalopram (CELEXA) 20 MG tablet Take 30 mg by mouth daily.    [provider]  clonazePAM (KLONOPIN) 0.5 MG tablet Take 0.5 mg by mouth 3 (three) times daily as needed for anxiety. Per pt's pcp note this is prescribed TID prn however pt takes TID daily    [provider]  famotidine (PEPCID) 20 MG tablet Take 20 mg by mouth daily.    [provider]  fluticasone (FLONASE) 50 MCG/ACT nasal spray Place 1 spray into both nostrils daily as needed.    [provider]  linaclotide Karlene Einstein) 145 MCG CAPS capsule Take 145 mcg by mouth daily before breakfast. Patient not taking: Reported on 12/09/2021    [provider]  MELATONIN PO Take by mouth at bedtime as needed.    [provider]  mupirocin ointment (BACTROBAN) 2 % 1 Application as needed. Per pt has area on leg that itching at times    [provider]  OVER THE COUNTER MEDICATION Take 1 capsule by mouth 3 (three) times daily. GOLO diet supplement, pt aware to stop today 12-09-2021 prior to surgery on 12-22-2021    [provider]  oxyCODONE 10 MG TABS Take 1 tablet (10 mg total) by  mouth every 6 (six) hours as needed for breakthrough pain. 12/23/21   Despina AriasMachen, Graham L, MD  pantoprazole (PROTONIX) 40 MG tablet Take 80 mg by mouth daily.    [provider]  Polyethylene Glycol 3350 (MIRALAX PO) Take by mouth every other day. Per pt alternates between miralax and metamucil , takes at night    [provider]  psyllium (METAMUCIL) 58.6 % powder Take 1 packet by mouth every other day. Per pt alternates w/ miralax , takes at night    [provider]  sodium chloride (OCEAN) 0.65 % SOLN nasal spray Place 1 spray into both nostrils as needed for congestion.     [provider]  testosterone cypionate (DEPO-TESTOSTERONE) 200 MG/ML injection Inject into the muscle every 14 (fourteen) days.    [provider]      Allergies    Augmentin [amoxicillin-pot clavulanate], Doxycycline, Erythromycin, and Thorazine [chlorpromazine]    Review of Systems   Review of Systems  Constitutional:  Negative for fever.  Respiratory:  Negative for shortness of breath.   Cardiovascular:  Negative for chest pain (Left lower chest wall/left upper quadrant tenderness over the rib cage), palpitations and leg swelling.  Gastrointestinal:  Negative for abdominal pain.  Neurological:  Positive for headaches (improved). Negative for syncope, weakness and light-headedness.  All other systems reviewed and are negative.   Physical Exam Updated Vital Signs BP (!) 169/81 (BP Location: Left Arm)   Pulse 87   Temp 98.7 F (37.1 C) (Oral)   Resp 19   Ht 6' (1.829 m)   Wt 99.8 kg   SpO2 100%   BMI 29.84 kg/m  Physical Exam Vitals and nursing note reviewed.  Constitutional:      General: He is not in acute distress.    Appearance: Normal appearance. He is not ill-appearing.  HENT:     Head: Normocephalic and atraumatic.     Nose: Nose normal.  Eyes:     Conjunctiva/sclera: Conjunctivae normal.  Cardiovascular:     Rate and Rhythm: Normal rate and regular rhythm.     Comments: Some tenderness over the left lower chest wall over the floating ribs. Pulmonary:     Effort: Pulmonary effort is normal. No respiratory distress.     Breath sounds: Normal breath sounds. No wheezing or rales.  Abdominal:     General: There is no distension.     Palpations: Abdomen is soft.     Tenderness: There is no abdominal tenderness. There is no guarding.  Musculoskeletal:        General: No deformity.     Right lower leg: No edema.     Left lower leg: No edema.  Skin:    Findings: No rash.  Neurological:     General: No focal deficit present.     Mental Status:  He is alert and oriented to person, place, and time. Mental status is at baseline.     Comments: Cranial nerves III through XII intact.  Full range of motion in bilateral upper and lower extremities with intact strength.  Station intact and symmetrical.  Tongue midline.  Without facial droop.     ED Results / Procedures / Treatments   Labs (all labs ordered are listed, but only abnormal results are displayed) Labs Reviewed - No data to display  EKG None  Radiology No results found.  Procedures Procedures    Medications Ordered in ED Medications - No data to display  ED Course/ Medical Decision Making/  A&P                           Medical Decision Making Amount and/or Complexity of Data Reviewed Radiology: ordered.  Risk Prescription drug management.   Medical Decision Making / ED Course   This patient presents to the ED for concern of headache, elevated blood pressure, this involves an extensive number of treatment options, and is a complaint that carries with it a high risk of complications and morbidity.  The differential diagnosis includes hypertensive emergency, uncontrolled blood pressure, elevated blood pressure secondary to methamphetamine use  MDM: 66 year old male presents today for evaluation of headache, elevated blood pressure.  Reports refractory symptoms been present for about a month.  States he took meth to alleviate his pain.  Without symptoms concerning for endorgan damage such as vision change, balance issues, chest pain, or shortness of breath.  Neurological exam without any focal deficits.  Will obtain chest x-ray to evaluate for rib fracture.  Will obtain EKG to ensure no acute change.  Otherwise from a hypertension standpoint blood pressure is improved and he is without signs of any endorgan damage.  No indication for additional management of hypertension he is already on antihypertensives which he can resume after discharge. Chest x-ray does  demonstrate for mildly displaced rib fractures from ribs 8 to rib 11.  Otherwise he appears comfortable.  This occurred about 5 weeks ago in mid October.  He has been taking ibuprofen.  Discussed adding on Tylenol as well for his pain regimen.  Will give dose of Toradol. Hydralazine, Lotensin given in the ED.  Lotensin as his home BP med.  On reevaluation blood pressure improved to 142/73.  He is for discharge.  Evaluated by attending Dr. Rosalia Hammers as well.  She is in agreement with plan.   Lab Tests: -I ordered, reviewed, and interpreted labs.   The pertinent results include:   Labs Reviewed - No data to display    EKG  EKG Interpretation  Date/Time:    Ventricular Rate:    PR Interval:    QRS Duration:   QT Interval:    QTC Calculation:   R Axis:     Text Interpretation:           Imaging Studies ordered: I ordered imaging studies including chest x-ray I independently visualized and interpreted imaging. I agree with the radiologist interpretation   Medicines ordered and prescription drug management: No orders of the defined types were placed in this encounter.   -I have reviewed the patients home medicines and have made adjustments as needed  Reevaluation: After the interventions noted above, I reevaluated the patient and found that they have :improved  Co morbidities that complicate the patient evaluation  Past Medical History:  Diagnosis Date   Alcohol use disorder, moderate, dependence (HCC)    per pcp note in epic    (12-09-2021  pt stated last drank alcohol 6 months ago)   Anxiety    Arthritis    Complication of anesthesia    12-09-2021  pt stated with ankle surgery in 1978 "my lung froze and could not breath"  told it was a medication that is no longer used)   DDD (degenerative disc disease), lumbar    Delayed gastric emptying    Depression    Drug induced constipation    chronic   ED (erectile dysfunction)    Esophageal achalasia    DG esophagus imaging  09-24-2021--  seen by surgeon dr Sheliah Hatch, pt is scheduled for esophageal manometry 03-23-2022   GERD (gastroesophageal reflux disease)    Hypertension    Hypogonadism in male    Mixed hyperlipidemia    Opioid dependence on agonist therapy (HCC)    followed by pcp---   twice daily suboxone   Polysubstance abuse (HCC)    cocaince, heroin, fentanyl, benzodiazepines, opiate addiction  (12-09-2021  pt stated stopped at age 46 but every 10 yrs ago he binge's,  stated last drug use was heroin IV narcon used by EMS (ED visit in epic)      Dispostion: Patient is appropriate for discharge.  Discharged in stable condition.  Return precautions discussed.  Patient voices understanding and is in agreement with plan.  Discussed keeping a blood pressure diary and following up with his primary care provider.  Final Clinical Impression(s) / ED Diagnoses Final diagnoses:  Uncontrolled hypertension  Closed fracture of multiple ribs of left side with routine healing, subsequent encounter    Rx / DC Orders ED Discharge Orders     None         Marita [place], PA-C 02/08/22 1425    Margarita Grizzle, MD 02/09/22 [phone]

## 2022-02-08 NOTE — ED Provider Notes (Signed)
66 yo male complaining of headache and hypertension. He thinks he may have mixed up yesterday's medicine and not take his bp med in the am. Headache loc around 9 pm with associated hypertension. Now bp and headache better No focal neuro deficits noted. Agree with plan for d/c    Margarita Grizzle, MD 02/14/22 1253

## 2022-02-14 ENCOUNTER — Ambulatory Visit: Payer: Self-pay | Admitting: General Surgery

## 2022-02-15 NOTE — Patient Instructions (Signed)
SURGICAL WAITING ROOM VISITATION Patients having surgery or a procedure may have no more than 2 support people in the waiting area - these visitors may rotate.   Children under the age of 43 must have an adult with them who is not the patient. If the patient needs to stay at the hospital during part of their recovery, the visitor guidelines for inpatient rooms apply. Pre-op nurse will coordinate an appropriate time for 1 support person to accompany patient in pre-op.  This support person may not rotate.    Please refer to the [facility] website for the visitor guidelines for Inpatients (after your surgery is over and you are in a regular room).       Your procedure is scheduled on:  02/22/2022    Report to [facility] Main Entrance    Report to admitting at  1130 AM   Call this number if you have problems the morning of surgery [phone]   Do not eat food :After Midnight.   After Midnight you may have the following liquids until _ 1030_____ AM  DAY OF SURGERY  Water Non-Citrus Juices (without pulp, NO RED) Carbonated Beverages Black Coffee (NO MILK/CREAM OR CREAMERS, sugar ok)  Clear Tea (NO MILK/CREAM OR CREAMERS, sugar ok) regular and decaf                             Plain Jell-O (NO RED)                                           Fruit ices (not with fruit pulp, NO RED)                                     Popsicles (NO RED)                                                               Sports drinks like Gatorade (NO RED)                    The day of surgery:  Drink ONE (1) Pre-Surgery Clear Ensure or G2 at  1030 AM ( have completed by )  the morning of surgery. Drink in one sitting. Do not sip.  This drink was given to you during your hospital  pre-op appointment visit. Nothing else to drink after completing the  Pre-Surgery Clear Ensure or G2.          If you have questions, please contact your surgeon's office.        Oral Hygiene is also important to  reduce your risk of infection.                                    Remember - BRUSH YOUR TEETH THE MORNING OF SURGERY WITH YOUR REGULAR TOOTHPASTE   Do NOT smoke after Midnight   Take these medicines the morning of surgery with A SIP OF WATER:  celexa, pepcid, protonix  DO NOT TAKE ANY ORAL DIABETIC MEDICATIONS DAY OF YOUR SURGERY  Bring CPAP mask and tubing day of surgery.                              You may not have any metal on your body including hair pins, jewelry, and body piercing             Do not wear make-up, lotions, powders, perfumes/cologne, or deodorant  Do not wear nail polish including gel and S&S, artificial/acrylic nails, or any other type of covering on natural nails including finger and toenails. If you have artificial nails, gel coating, etc. that needs to be removed by a nail salon please have this removed prior to surgery or surgery may need to be canceled/ delayed if the surgeon/ anesthesia feels like they are unable to be safely monitored.   Do not shave  48 hours prior to surgery.               Men may shave face and neck.   Do not bring valuables to the hospital. [place].   Contacts, dentures or bridgework may not be worn into surgery.   Bring small overnight bag day of surgery.   DO NOT [place]. PHARMACY WILL DISPENSE MEDICATIONS LISTED ON YOUR MEDICATION LIST TO YOU DURING YOUR ADMISSION [place]!    Patients discharged on the day of surgery will not be allowed to drive home.  Someone NEEDS to stay with you for the first 24 hours after anesthesia.   Special Instructions: Bring a copy of your healthcare power of attorney and living will documents the day of surgery if you haven't scanned them before.              Please read over the following fact sheets you were given: IF [place] [phone]   If you  received a COVID test during your pre-op visit  it is requested that you wear a mask when out in public, stay away from anyone that may not be feeling well and notify your surgeon if you develop symptoms. If you test positive for Covid or have been in contact with anyone that has tested positive in the last 10 days please notify you surgeon.    [place] - Preparing for Surgery Before surgery, you can play an important role.  Because skin is not sterile, your skin needs to be as free of germs as possible.  You can reduce the number of germs on your skin by washing with CHG (chlorahexidine gluconate) soap before surgery.  CHG is an antiseptic cleaner which kills germs and bonds with the skin to continue killing germs even after washing. Please DO NOT use if you have an allergy to CHG or antibacterial soaps.  If your skin becomes reddened/irritated stop using the CHG and inform your nurse when you arrive at Short Stay. Do not shave (including legs and underarms) for at least 48 hours prior to the first CHG shower.  You may shave your face/neck. Please follow these instructions carefully:  1.  Shower with CHG Soap the night before surgery and the  morning of Surgery.  2.  If you choose to wash your hair, wash your hair first as usual with  your  normal  shampoo.  3.  After you shampoo, rinse your hair and body thoroughly to remove the  shampoo.                           4.  Use CHG as you would any other liquid soap.  You can apply chg directly  to the skin and wash                       Gently with a scrungie or clean washcloth.  5.  Apply the CHG Soap to your body ONLY FROM THE NECK DOWN.   Do not use on face/ open                           Wound or open sores. Avoid contact with eyes, ears mouth and genitals (private parts).                       Wash face,  Genitals (private parts) with your normal soap.             6.  Wash thoroughly, paying special attention to the area where your surgery  will be  performed.  7.  Thoroughly rinse your body with warm water from the neck down.  8.  DO NOT shower/wash with your normal soap after using and rinsing off  the CHG Soap.                9.  Pat yourself dry with a clean towel.            10.  Wear clean pajamas.            11.  Place clean sheets on your bed the night of your first shower and do not  sleep with pets. Day of Surgery : Do not apply any lotions/deodorants the morning of surgery.  Please wear clean clothes to the hospital/surgery center.  FAILURE TO FOLLOW THESE INSTRUCTIONS MAY RESULT IN THE CANCELLATION OF YOUR SURGERY PATIENT SIGNATURE_________________________________  NURSE SIGNATURE__________________________________  ________________________________________________________________________

## 2022-02-15 NOTE — Progress Notes (Signed)
Anesthesia Review:  PCP: Mady Gemma- LOV 01/20/22  Cardiologist : Chest x-ray : EKG : 02/08/22  Echo : Stress test: Cardiac Cath :  Activity level:  Sleep Study/ CPAP : Fasting Blood Sugar :      / Checks Blood Sugar -- times a day:   Blood Thinner/ Instructions /Last Dose: ASA / Instructions/ Last Dose :   02/08/22- in ED for hypertension

## 2022-02-17 ENCOUNTER — Other Ambulatory Visit: Payer: Self-pay

## 2022-02-17 ENCOUNTER — Encounter (HOSPITAL_COMMUNITY)
Admission: RE | Admit: 2022-02-17 | Discharge: 2022-02-17 | Disposition: A | Discharge status: Home / Self Care | Payer: Medicare Other | Source: Ambulatory Visit | Attending: General Surgery | Admitting: General Surgery

## 2022-02-17 ENCOUNTER — Encounter (HOSPITAL_COMMUNITY): Payer: Self-pay

## 2022-02-17 VITALS — BP 103/62 | HR 62 | Temp 98.0°F | Resp 16 | Ht 72.0 in | Wt 225.0 lb

## 2022-02-17 DIAGNOSIS — Z01818 Encounter for other preprocedural examination: Secondary | ICD-10-CM | POA: Diagnosis present

## 2022-02-17 HISTORY — DX: Sleep apnea, unspecified: G47.30

## 2022-02-17 LAB — CBC
HCT: 43.6 % (ref 39.0–52.0)
Hemoglobin: 13.8 g/dL (ref 13.0–17.0)
MCH: 26.3 pg (ref 26.0–34.0)
MCHC: 31.7 g/dL (ref 30.0–36.0)
MCV: 83 fL (ref 80.0–100.0)
Platelets: 192 10*3/uL (ref 150–400)
RBC: 5.25 MIL/uL (ref 4.22–5.81)
RDW: 14.6 % (ref 11.5–15.5)
WBC: 6.5 10*3/uL (ref 4.0–10.5)
nRBC: 0 % (ref 0.0–0.2)

## 2022-02-17 LAB — COMPREHENSIVE METABOLIC PANEL
ALT: 22 U/L (ref 0–44)
AST: 27 U/L (ref 15–41)
Albumin: 3.7 g/dL (ref 3.5–5.0)
Alkaline Phosphatase: 77 U/L (ref 38–126)
Anion gap: 8 (ref 5–15)
BUN: 12 mg/dL (ref 8–23)
CO2: 27 mmol/L (ref 22–32)
Calcium: 8.7 mg/dL — ABNORMAL LOW (ref 8.9–10.3)
Chloride: 102 mmol/L (ref 98–111)
Creatinine, Ser: 0.81 mg/dL (ref 0.61–1.24)
GFR, Estimated: >60 mL/min (ref >60)
Glucose, Bld: 98 mg/dL (ref 70–99)
Potassium: 3.7 mmol/L (ref 3.5–5.1)
Sodium: 137 mmol/L (ref 135–145)
Total Bilirubin: 0.7 mg/dL (ref 0.3–1.2)
Total Protein: 6.5 g/dL (ref 6.5–8.1)

## 2022-02-22 ENCOUNTER — Encounter (HOSPITAL_COMMUNITY)
Admission: RE | Disposition: A | Discharge status: Home / Self Care | Payer: Self-pay | Source: Home / Self Care | Attending: General Surgery

## 2022-02-22 ENCOUNTER — Inpatient Hospital Stay (HOSPITAL_COMMUNITY): Payer: Medicare Other | Admitting: Physician Assistant

## 2022-02-22 ENCOUNTER — Other Ambulatory Visit: Payer: Self-pay

## 2022-02-22 ENCOUNTER — Inpatient Hospital Stay (HOSPITAL_COMMUNITY)
Admission: RE | Admit: 2022-02-22 | Discharge: 2022-02-23 | DRG: 327 | Disposition: A | Discharge status: Home / Self Care | Payer: Medicare Other | Attending: General Surgery | Admitting: General Surgery

## 2022-02-22 ENCOUNTER — Encounter (HOSPITAL_COMMUNITY): Payer: Self-pay | Admitting: General Surgery

## 2022-02-22 ENCOUNTER — Inpatient Hospital Stay (HOSPITAL_COMMUNITY): Payer: Medicare Other | Admitting: Anesthesiology

## 2022-02-22 DIAGNOSIS — G4730 Sleep apnea, unspecified: Secondary | ICD-10-CM | POA: Diagnosis present

## 2022-02-22 DIAGNOSIS — M1990 Unspecified osteoarthritis, unspecified site: Secondary | ICD-10-CM | POA: Diagnosis present

## 2022-02-22 DIAGNOSIS — Z87891 Personal history of nicotine dependence: Secondary | ICD-10-CM

## 2022-02-22 DIAGNOSIS — Z881 Allergy status to other antibiotic agents status: Secondary | ICD-10-CM

## 2022-02-22 DIAGNOSIS — Z803 Family history of malignant neoplasm of breast: Secondary | ICD-10-CM

## 2022-02-22 DIAGNOSIS — I10 Essential (primary) hypertension: Secondary | ICD-10-CM | POA: Diagnosis not present

## 2022-02-22 DIAGNOSIS — Z79899 Other long term (current) drug therapy: Secondary | ICD-10-CM

## 2022-02-22 DIAGNOSIS — F418 Other specified anxiety disorders: Secondary | ICD-10-CM

## 2022-02-22 DIAGNOSIS — Z808 Family history of malignant neoplasm of other organs or systems: Secondary | ICD-10-CM | POA: Diagnosis not present

## 2022-02-22 DIAGNOSIS — Z8249 Family history of ischemic heart disease and other diseases of the circulatory system: Secondary | ICD-10-CM

## 2022-02-22 DIAGNOSIS — K220 Achalasia of cardia: Secondary | ICD-10-CM | POA: Diagnosis present

## 2022-02-22 DIAGNOSIS — Z880 Allergy status to penicillin: Secondary | ICD-10-CM

## 2022-02-22 DIAGNOSIS — F1120 Opioid dependence, uncomplicated: Secondary | ICD-10-CM | POA: Diagnosis present

## 2022-02-22 DIAGNOSIS — Z888 Allergy status to other drugs, medicaments and biological substances status: Secondary | ICD-10-CM

## 2022-02-22 DIAGNOSIS — Z823 Family history of stroke: Secondary | ICD-10-CM | POA: Diagnosis not present

## 2022-02-22 DIAGNOSIS — K219 Gastro-esophageal reflux disease without esophagitis: Secondary | ICD-10-CM | POA: Diagnosis present

## 2022-02-22 DIAGNOSIS — Z833 Family history of diabetes mellitus: Secondary | ICD-10-CM

## 2022-02-22 LAB — CBC
HCT: 46.7 % (ref 39.0–52.0)
Hemoglobin: 14.8 g/dL (ref 13.0–17.0)
MCH: 26.7 pg (ref 26.0–34.0)
MCHC: 31.7 g/dL (ref 30.0–36.0)
MCV: 84.3 fL (ref 80.0–100.0)
Platelets: 199 10*3/uL (ref 150–400)
RBC: 5.54 MIL/uL (ref 4.22–5.81)
RDW: 14.4 % (ref 11.5–15.5)
WBC: 11.3 10*3/uL — ABNORMAL HIGH (ref 4.0–10.5)
nRBC: 0 % (ref 0.0–0.2)

## 2022-02-22 LAB — CREATININE, SERUM
Creatinine, Ser: 0.94 mg/dL (ref 0.61–1.24)
GFR, Estimated: >60 mL/min (ref >60)

## 2022-02-22 SURGERY — ESOPHAGOMYOTOMY, ROBOT-ASSISTED, HELLER
Anesthesia: General

## 2022-02-22 MED ORDER — ACETAMINOPHEN 500 MG PO TABS
1000.0000 mg | ORAL_TABLET | ORAL | Status: DC
  Filled 2022-02-22: qty 2

## 2022-02-22 MED ORDER — LACTATED RINGERS IV SOLN: INTRAVENOUS | Status: DC | PRN

## 2022-02-22 MED ORDER — TRAMADOL HCL 50 MG PO TABS
50.0000 mg | ORAL_TABLET | Freq: Four times a day (QID) | ORAL | Status: DC | PRN
  Administered 2022-02-22: 50 mg via ORAL
  Filled 2022-02-22: qty 1

## 2022-02-22 MED ORDER — METOPROLOL TARTRATE 5 MG/5ML IV SOLN: 5.0000 mg | Freq: Four times a day (QID) | INTRAVENOUS | Status: DC | PRN

## 2022-02-22 MED ORDER — OXYCODONE HCL 5 MG PO TABS
5.0000 mg | ORAL_TABLET | ORAL | Status: DC | PRN
  Administered 2022-02-22 – 2022-02-23 (×3): 10 mg via ORAL
  Filled 2022-02-22 (×3): qty 2

## 2022-02-22 MED ORDER — POLYETHYLENE GLYCOL 3350 17 G PO PACK: 17.0000 g | PACK | Freq: Every day | ORAL | Status: DC | PRN

## 2022-02-22 MED ORDER — FENTANYL CITRATE (PF) 250 MCG/5ML IJ SOLN
INTRAMUSCULAR | Status: DC | PRN
  Administered 2022-02-22: 50 ug via INTRAVENOUS
  Administered 2022-02-22: 100 ug via INTRAVENOUS
  Administered 2022-02-22 (×2): 50 ug via INTRAVENOUS

## 2022-02-22 MED ORDER — ARTIFICIAL TEARS OPHTHALMIC OINT
TOPICAL_OINTMENT | OPHTHALMIC | Status: AC
  Filled 2022-02-22: qty 3.5

## 2022-02-22 MED ORDER — PANTOPRAZOLE SODIUM 40 MG IV SOLR
40.0000 mg | Freq: Every day | INTRAVENOUS | Status: DC
  Administered 2022-02-22: 40 mg via INTRAVENOUS
  Filled 2022-02-22: qty 10

## 2022-02-22 MED ORDER — ROCURONIUM BROMIDE 10 MG/ML (PF) SYRINGE
PREFILLED_SYRINGE | INTRAVENOUS | Status: AC
  Filled 2022-02-22: qty 10

## 2022-02-22 MED ORDER — PHENYLEPHRINE HCL-NACL 20-0.9 MG/250ML-% IV SOLN
INTRAVENOUS | Status: DC | PRN
  Administered 2022-02-22: 35 ug/min via INTRAVENOUS

## 2022-02-22 MED ORDER — PHENYLEPHRINE 80 MCG/ML (10ML) SYRINGE FOR IV PUSH (FOR BLOOD PRESSURE SUPPORT)
PREFILLED_SYRINGE | INTRAVENOUS | Status: AC
  Filled 2022-02-22: qty 10

## 2022-02-22 MED ORDER — DEXAMETHASONE SODIUM PHOSPHATE 10 MG/ML IJ SOLN
INTRAMUSCULAR | Status: DC | PRN
  Administered 2022-02-22: 10 mg via INTRAVENOUS

## 2022-02-22 MED ORDER — PROPOFOL 10 MG/ML IV BOLUS
INTRAVENOUS | Status: DC | PRN
  Administered 2022-02-22: 150 mg via INTRAVENOUS

## 2022-02-22 MED ORDER — DEXTROSE-NACL 5-0.45 % IV SOLN: INTRAVENOUS | Status: DC

## 2022-02-22 MED ORDER — BUPIVACAINE LIPOSOME 1.3 % IJ SUSP
INTRAMUSCULAR | Status: DC | PRN
  Administered 2022-02-22: 20 mL

## 2022-02-22 MED ORDER — ROCURONIUM BROMIDE 10 MG/ML (PF) SYRINGE
PREFILLED_SYRINGE | INTRAVENOUS | Status: DC | PRN
  Administered 2022-02-22: 20 mg via INTRAVENOUS
  Administered 2022-02-22: 70 mg via INTRAVENOUS
  Administered 2022-02-22: 30 mg via INTRAVENOUS

## 2022-02-22 MED ORDER — KETAMINE HCL 10 MG/ML IJ SOLN
INTRAMUSCULAR | Status: DC | PRN
  Administered 2022-02-22: 50 mg via INTRAVENOUS

## 2022-02-22 MED ORDER — BUPIVACAINE-EPINEPHRINE (PF) 0.5% -1:200000 IJ SOLN
INTRAMUSCULAR | Status: AC
  Filled 2022-02-22: qty 30

## 2022-02-22 MED ORDER — CHLORHEXIDINE GLUCONATE 0.12% ORAL RINSE (MEDLINE KIT)
15.0000 mL | Freq: Once | OROMUCOSAL | Status: AC
  Administered 2022-02-22: 15 mL via OROMUCOSAL

## 2022-02-22 MED ORDER — EPHEDRINE SULFATE-NACL 50-0.9 MG/10ML-% IV SOSY
PREFILLED_SYRINGE | INTRAVENOUS | Status: DC | PRN
  Administered 2022-02-22: 10 mg via INTRAVENOUS

## 2022-02-22 MED ORDER — PROPOFOL 10 MG/ML IV BOLUS
INTRAVENOUS | Status: AC
  Filled 2022-02-22: qty 20

## 2022-02-22 MED ORDER — ENOXAPARIN SODIUM 40 MG/0.4ML IJ SOSY
40.0000 mg | PREFILLED_SYRINGE | INTRAMUSCULAR | Status: DC
  Administered 2022-02-23: 40 mg via SUBCUTANEOUS
  Filled 2022-02-22: qty 0.4

## 2022-02-22 MED ORDER — CEFAZOLIN SODIUM-DEXTROSE 2-4 GM/100ML-% IV SOLN
2.0000 g | INTRAVENOUS | Status: AC
  Administered 2022-02-22: 2 g via INTRAVENOUS
  Filled 2022-02-22: qty 100

## 2022-02-22 MED ORDER — PHENYLEPHRINE 80 MCG/ML (10ML) SYRINGE FOR IV PUSH (FOR BLOOD PRESSURE SUPPORT)
PREFILLED_SYRINGE | INTRAVENOUS | Status: DC | PRN
  Administered 2022-02-22: 40 ug via INTRAVENOUS
  Administered 2022-02-22: 80 ug via INTRAVENOUS

## 2022-02-22 MED ORDER — BENAZEPRIL HCL 10 MG PO TABS
10.0000 mg | ORAL_TABLET | Freq: Every day | ORAL | Status: DC
  Filled 2022-02-22: qty 1

## 2022-02-22 MED ORDER — CHLORHEXIDINE GLUCONATE CLOTH 2 % EX PADS: 6.0000 | MEDICATED_PAD | Freq: Once | CUTANEOUS | Status: DC

## 2022-02-22 MED ORDER — ONDANSETRON HCL 4 MG/2ML IJ SOLN
4.0000 mg | Freq: Four times a day (QID) | INTRAMUSCULAR | Status: DC | PRN
  Administered 2022-02-23: 4 mg via INTRAVENOUS
  Filled 2022-02-22: qty 2

## 2022-02-22 MED ORDER — HYDROMORPHONE HCL 1 MG/ML IJ SOLN
INTRAMUSCULAR | Status: AC
  Administered 2022-02-22: 0.5 mg via INTRAVENOUS
  Filled 2022-02-22: qty 1

## 2022-02-22 MED ORDER — MIDAZOLAM HCL 2 MG/2ML IJ SOLN
INTRAMUSCULAR | Status: AC
  Filled 2022-02-22: qty 2

## 2022-02-22 MED ORDER — SUGAMMADEX SODIUM 500 MG/5ML IV SOLN
INTRAVENOUS | Status: AC
  Filled 2022-02-22: qty 5

## 2022-02-22 MED ORDER — DEXAMETHASONE SODIUM PHOSPHATE 10 MG/ML IJ SOLN
INTRAMUSCULAR | Status: AC
  Filled 2022-02-22: qty 1

## 2022-02-22 MED ORDER — BUPIVACAINE LIPOSOME 1.3 % IJ SUSP
INTRAMUSCULAR | Status: AC
  Filled 2022-02-22: qty 20

## 2022-02-22 MED ORDER — LACTATED RINGERS IV SOLN: INTRAVENOUS | Status: DC

## 2022-02-22 MED ORDER — AMISULPRIDE (ANTIEMETIC) 5 MG/2ML IV SOLN: 10.0000 mg | Freq: Once | INTRAVENOUS | Status: DC | PRN

## 2022-02-22 MED ORDER — SUGAMMADEX SODIUM 500 MG/5ML IV SOLN
INTRAVENOUS | Status: DC | PRN
  Administered 2022-02-22: 225 mg via INTRAVENOUS

## 2022-02-22 MED ORDER — MIDAZOLAM HCL 2 MG/2ML IJ SOLN
INTRAMUSCULAR | Status: DC | PRN
  Administered 2022-02-22: 2 mg via INTRAVENOUS

## 2022-02-22 MED ORDER — HYDROMORPHONE HCL 1 MG/ML IJ SOLN
0.5000 mg | INTRAMUSCULAR | Status: DC | PRN
  Administered 2022-02-22 – 2022-02-23 (×2): 0.5 mg via INTRAVENOUS
  Filled 2022-02-22 (×2): qty 0.5

## 2022-02-22 MED ORDER — ACETAMINOPHEN 10 MG/ML IV SOLN
INTRAVENOUS | Status: DC | PRN
  Administered 2022-02-22: 1000 mg via INTRAVENOUS

## 2022-02-22 MED ORDER — 0.9 % SODIUM CHLORIDE (POUR BTL) OPTIME
TOPICAL | Status: DC | PRN
  Administered 2022-02-22: 1000 mL

## 2022-02-22 MED ORDER — EPHEDRINE 5 MG/ML INJ
INTRAVENOUS | Status: AC
  Filled 2022-02-22: qty 5

## 2022-02-22 MED ORDER — LIDOCAINE 2% (20 MG/ML) 5 ML SYRINGE
INTRAMUSCULAR | Status: DC | PRN
  Administered 2022-02-22: 1.5 mg/kg/h via INTRAVENOUS
  Administered 2022-02-22: 100 mg via INTRAVENOUS

## 2022-02-22 MED ORDER — FENTANYL CITRATE (PF) 250 MCG/5ML IJ SOLN
INTRAMUSCULAR | Status: AC
  Filled 2022-02-22: qty 5

## 2022-02-22 MED ORDER — LACTATED RINGERS IR SOLN
Status: DC | PRN
  Administered 2022-02-22: 1000 mL

## 2022-02-22 MED ORDER — ACETAMINOPHEN 10 MG/ML IV SOLN
INTRAVENOUS | Status: AC
  Filled 2022-02-22: qty 100

## 2022-02-22 MED ORDER — BUPIVACAINE-EPINEPHRINE 0.5% -1:200000 IJ SOLN
INTRAMUSCULAR | Status: DC | PRN
  Administered 2022-02-22: 30 mL

## 2022-02-22 MED ORDER — DIPHENHYDRAMINE HCL 50 MG/ML IJ SOLN: 12.5000 mg | Freq: Four times a day (QID) | INTRAMUSCULAR | Status: DC | PRN

## 2022-02-22 MED ORDER — HYDROMORPHONE HCL 1 MG/ML IJ SOLN
0.2500 mg | INTRAMUSCULAR | Status: DC | PRN
  Administered 2022-02-22 (×2): 0.5 mg via INTRAVENOUS

## 2022-02-22 MED ORDER — MEPERIDINE HCL 50 MG/ML IJ SOLN: 6.2500 mg | INTRAMUSCULAR | Status: DC | PRN

## 2022-02-22 MED ORDER — LIDOCAINE HCL (PF) 2 % IJ SOLN
INTRAMUSCULAR | Status: AC
  Filled 2022-02-22: qty 5

## 2022-02-22 MED ORDER — LIDOCAINE HCL 2 % IJ SOLN
INTRAMUSCULAR | Status: AC
  Filled 2022-02-22: qty 20

## 2022-02-22 MED ORDER — DIPHENHYDRAMINE HCL 12.5 MG/5ML PO ELIX: 12.5000 mg | ORAL_SOLUTION | Freq: Four times a day (QID) | ORAL | Status: DC | PRN

## 2022-02-22 MED ORDER — DEXMEDETOMIDINE HCL IN NACL 80 MCG/20ML IV SOLN
INTRAVENOUS | Status: DC | PRN
  Administered 2022-02-22: 8 ug via BUCCAL
  Administered 2022-02-22: 12 ug via BUCCAL

## 2022-02-22 MED ORDER — ONDANSETRON HCL 4 MG/2ML IJ SOLN
INTRAMUSCULAR | Status: AC
  Filled 2022-02-22: qty 2

## 2022-02-22 MED ORDER — ACETAMINOPHEN 500 MG PO TABS
1000.0000 mg | ORAL_TABLET | Freq: Four times a day (QID) | ORAL | Status: DC
  Administered 2022-02-22: 1000 mg via ORAL
  Filled 2022-02-22 (×2): qty 2

## 2022-02-22 MED ORDER — ONDANSETRON HCL 4 MG/2ML IJ SOLN
INTRAMUSCULAR | Status: DC | PRN
  Administered 2022-02-22: 4 mg via INTRAVENOUS

## 2022-02-22 MED ORDER — ONDANSETRON 4 MG PO TBDP: 4.0000 mg | ORAL_TABLET | Freq: Four times a day (QID) | ORAL | Status: DC | PRN

## 2022-02-22 MED ORDER — BUPIVACAINE LIPOSOME 1.3 % IJ SUSP: 20.0000 mL | Freq: Once | INTRAMUSCULAR | Status: DC

## 2022-02-22 MED ORDER — DEXMEDETOMIDINE HCL IN NACL 80 MCG/20ML IV SOLN
INTRAVENOUS | Status: AC
  Filled 2022-02-22: qty 20

## 2022-02-22 SURGICAL SUPPLY — 55 items
ANTIFOG SOL W/FOAM PAD STRL (MISCELLANEOUS) ×1
APPLIER CLIP 5 13 M/L LIGAMAX5 (MISCELLANEOUS)
APPLIER CLIP ROT 10 11.4 M/L (STAPLE)
BAG COUNTER SPONGE SURGICOUNT (BAG) ×1 IMPLANT
BLADE SURG SZ11 CARB STEEL (BLADE) ×1 IMPLANT
CHLORAPREP W/TINT 26 (MISCELLANEOUS) ×1 IMPLANT
CLIP APPLIE 5 13 M/L LIGAMAX5 (MISCELLANEOUS) IMPLANT
CLIP APPLIE ROT 10 11.4 M/L (STAPLE) IMPLANT
COVER SURGICAL LIGHT HANDLE (MISCELLANEOUS) ×1 IMPLANT
COVER TIP SHEARS 8 DVNC (MISCELLANEOUS) IMPLANT
COVER TIP SHEARS 8MM DA VINCI (MISCELLANEOUS)
DERMABOND ADVANCED .7 DNX12 (GAUZE/BANDAGES/DRESSINGS) ×1 IMPLANT
DRAIN PENROSE 0.5X18 (DRAIN) IMPLANT
DRAPE ARM DVNC X/XI (DISPOSABLE) ×4 IMPLANT
DRAPE COLUMN DVNC XI (DISPOSABLE) ×1 IMPLANT
DRAPE DA VINCI XI ARM (DISPOSABLE) ×4
DRAPE DA VINCI XI COLUMN (DISPOSABLE) ×1
DRAPE ORTHO SPLIT 77X108 STRL (DRAPES) ×1
DRAPE SURG ORHT 6 SPLT 77X108 (DRAPES) ×1 IMPLANT
ELECT REM PT RETURN 15FT ADLT (MISCELLANEOUS) ×1 IMPLANT
GAUZE 4X4 16PLY ~~LOC~~+RFID DBL (SPONGE) ×1 IMPLANT
GLOVE BIOGEL PI IND STRL 7.0 (GLOVE) ×2 IMPLANT
GLOVE SURG SS PI 7.0 STRL IVOR (GLOVE) ×2 IMPLANT
GOWN STRL REUS W/ TWL LRG LVL3 (GOWN DISPOSABLE) ×2 IMPLANT
GOWN STRL REUS W/ TWL XL LVL3 (GOWN DISPOSABLE) IMPLANT
GOWN STRL REUS W/TWL LRG LVL3 (GOWN DISPOSABLE) ×2
GOWN STRL REUS W/TWL XL LVL3 (GOWN DISPOSABLE)
IRRIG SUCT STRYKERFLOW 2 WTIP (MISCELLANEOUS) ×1
IRRIGATION SUCT STRKRFLW 2 WTP (MISCELLANEOUS) ×1 IMPLANT
KIT BASIN OR (CUSTOM PROCEDURE TRAY) ×1 IMPLANT
KIT TURNOVER KIT A (KITS) IMPLANT
LUBRICANT JELLY K Y 4OZ (MISCELLANEOUS) IMPLANT
MARKER SKIN DUAL TIP RULER LAB (MISCELLANEOUS) ×1 IMPLANT
NEEDLE HYPO 22GX1.5 SAFETY (NEEDLE) ×1 IMPLANT
PACK CARDIOVASCULAR III (CUSTOM PROCEDURE TRAY) ×1 IMPLANT
SCISSORS LAP 5X35 DISP (ENDOMECHANICALS) IMPLANT
SEAL CANN UNIV 5-8 DVNC XI (MISCELLANEOUS) ×4 IMPLANT
SEAL XI 5MM-8MM UNIVERSAL (MISCELLANEOUS) ×4
SEALER VESSEL DA VINCI XI (MISCELLANEOUS) ×1
SEALER VESSEL EXT DVNC XI (MISCELLANEOUS) ×1 IMPLANT
SET TUBE SMOKE EVAC HIGH FLOW (TUBING) ×1 IMPLANT
SOLUTION ANTFG W/FOAM PAD STRL (MISCELLANEOUS) ×1 IMPLANT
SOLUTION ELECTROLUBE (MISCELLANEOUS) IMPLANT
SPIKE FLUID TRANSFER (MISCELLANEOUS) ×1 IMPLANT
SUT ETHIBOND 0 36 GRN (SUTURE) ×2 IMPLANT
SUT MNCRL AB 4-0 PS2 18 (SUTURE) ×1 IMPLANT
SUT SILK 0 SH 30 (SUTURE) IMPLANT
SUT SILK 2 0 SH (SUTURE) IMPLANT
SYR 20ML LL LF (SYRINGE) ×2 IMPLANT
TIP INNERVISION DETACH 40FR (MISCELLANEOUS) IMPLANT
TIP INNERVISION DETACH 50FR (MISCELLANEOUS) IMPLANT
TIP INNERVISION DETACH 56FR (MISCELLANEOUS) IMPLANT
TOWEL OR 17X26 10 PK STRL BLUE (TOWEL DISPOSABLE) ×1 IMPLANT
TRAY FOLEY MTR SLVR 16FR STAT (SET/KITS/TRAYS/PACK) IMPLANT
TROCAR Z-THREAD OPTICAL 5X100M (TROCAR) ×1 IMPLANT

## 2022-02-22 NOTE — H&P (Signed)
Chief Complaint: Achalasia  History of Present Illness: Tyler Coleman is a 66 y.o. male who is seen today for achalasia.    Review of Systems: A complete review of systems was obtained from the patient. I have reviewed this information and discussed as appropriate with the patient. See HPI as well for other ROS.  Review of Systems  Constitutional: Negative.  HENT: Negative.  Eyes: Negative.  Respiratory: Negative.  Cardiovascular: Negative.  Gastrointestinal: Negative.  Genitourinary: Negative.  Musculoskeletal: Negative.  Skin: Negative.  Neurological: Negative.  Endo/Heme/Allergies: Negative.  Psychiatric/Behavioral: Negative.    Medical History: Past Medical History:  Diagnosis Date  Anxiety  Arthritis  GERD (gastroesophageal reflux disease)  Hypertension  Sleep apnea  Substance abuse (CMS-HCC)   There is no problem list on file for this patient.  Past Surgical History:  Procedure Laterality Date  Ankle Surgery 1978  Back Surgery 1980  and 1985  Shoulder Surgery 1995  and 2000  INSERTION OF MALLEABLE PENILE PROSTHESIS 12/22/2021  Dr. Ebbie Ridge  ESOPHAGEAL MANOMETRY 01/05/2022  Dr. Lavon Paganini    Allergies  Allergen Reactions  Chlorpromazine Other (See Comments)   Current Outpatient Medications on File Prior to Visit  Medication Sig Dispense Refill  benazepriL (LOTENSIN) 10 MG tablet 1 tablet  buprenorphine-naloxone (SUBOXONE) 12-3 mg SL film DISSOLVE ONE FILM UNDER THE TONGUE 2 TIMES DAILY (Patient not taking: Reported on 02/02/2022)  citalopram (CELEXA) 20 MG tablet 1.5 tablets  famotidine (PEPCID) 20 MG tablet 1 tablet at bedtime as needed  LINZESS 145 mcg capsule Take 145 mcg by mouth once daily  mupirocin (BACTROBAN) 2 % ointment Coat lightly inside of nostril three times per day  pantoprazole (PROTONIX) 40 MG DR tablet Take 1 tablet by mouth 2 (two) times daily  testosterone cypionate (DEPO-TESTOSTERONE) 200 mg/mL injection 0.3 mL   No  current facility-administered medications on file prior to visit.   Family History  Problem Relation Age of Onset  Coronary Artery Disease (Blocked arteries around heart) Mother  High blood pressure (Hypertension) Father  Coronary Artery Disease (Blocked arteries around heart) Father  Diabetes Sister  Breast cancer Sister  Stroke Brother  Skin cancer Brother    Social History   Tobacco Use  Smoking Status Former  Types: Cigarettes  Quit date: 1984  Years since quitting: 39.8  Smokeless Tobacco Never    Social History   Socioeconomic History  Marital status: Single  Tobacco Use  Smoking status: Former  Types: Cigarettes  Quit date: 1984  Years since quitting: 39.8  Smokeless tobacco: Never  Substance and Sexual Activity  Alcohol use: Never  Drug use: Never   Objective:   There were no vitals filed for this visit.  There is no height or weight on file to calculate BMI.  Physical Exam Constitutional:  Appearance: Normal appearance.  HENT:  Head: Normocephalic and atraumatic.  Pulmonary:  Effort: Pulmonary effort is normal.  Musculoskeletal:  General: Normal range of motion.  Cervical back: Normal range of motion.  Neurological:  General: No focal deficit present.  Mental Status: He is alert and oriented to person, place, and time. Mental status is at baseline.  Psychiatric:  Mood and Affect: Mood normal.  Behavior: Behavior normal.  Thought Content: Thought content normal.     Labs, Imaging and Diagnostic Testing:  I reviewed manometry images  Assessment and Plan:   Diagnoses and all orders for this visit:  Achalasia    The anatomy & physiology of the foregut and anti-reflux mechanism was discussed.  The pathophysiology of achalasia was discussed. Natural history risks without surgery was discussed. The patient's symptoms are not adequately controlled by non-operative treatments. I feel the risks of no intervention will lead to serious problems  that outweigh the operative risks; therefore, I recommended surgery to relax the fibrotic LES & rebuild the anti-reflux valve to control reflux. Need for a thorough workup to rule out the differential diagnosis and plan treatment was explained. I explained laparoscopic techniques with possible need for an open approach.  Risks such as bleeding, infection, abscess, leak, injury to other organs, need for repair of tissues / organs, need for further treatment, heart attack, death, and other risks were discussed. I noted a good likelihood this will help address the problem. Goals of post-operative recovery were discussed as well. Possibility that this will not correct all symptoms was explained. Post-operative dysphagia, need for short-term liquid & pureed diet, inability to vomit, possibility of recurrence needing further treatment, possible need for medicines to help control symptoms in addition to surgery were discussed. We will work to minimize complications. Educational handouts further explaining the pathology, treatment options, and dysphagia diet was given as well. Questions were answered. The patient expresses understanding & wishes to proceed with surgery.

## 2022-02-22 NOTE — Progress Notes (Signed)
1200 Refused to take po Tylenol 1000mg  Dr. made aware.

## 2022-02-22 NOTE — Op Note (Addendum)
Preoperative diagnosis: Achalasia  Postoperative diagnosis: same   Procedure: robotic Heller Myotomy hernia repair, robotic Toupet fundoplication  Surgeon: Feliciana Rossetti, M.D.  Asst: Gaynelle Adu, M.D.  Anesthesia: general  Indications for procedure: Tyler Coleman is a 66 y.o. year old male with symptoms of dysphagia. UGI and manometry showed achalasia. After discussing options he decided to proceed with minimally invasive Heller myotomy with fundoplication.  Description of procedure: The patient was brought into the operative suite. Anesthesia was administered with General endotracheal anesthesia. WHO checklist was applied. The patient was then placed in supine position. The area was prepped and draped in the usual sterile fashion.  Next, a left mid abdominal incision was made. A 57mm trocar was used to gain access to the peritoneal cavity by optical entry technique. Pneumoperitoneum was applied with a high flow and low pressure. The laparoscope was reinserted to confirm position. An 8 mm trocar was placed in the left mid abdomen and an 8 mm trocar was placed in the left lateral abdomen. An 8 mm trocar was placed in the right mid abdomen. A 5 mm trocar was placed in the right upper abdomen. Bilateral TAP blocks were placed with Marcaine/Exparel mix.  A Nathanson retractor was placed in the subxiphoid space and used to retract the left lobe of the liver.  The pars flaccida was divided with harmonic scalpel The peritoneum of the right crus was divided and the sac separated from the chest contents. This plane was continued anteriorly and to the left crus. Next, the posterior area was dissected free. Additional care was used to dissect attachments of the anterior esophagus to create space for the myotomy. The short gastric vessels to the fundus were taken with vessel sealer.  Next, a hook cautery was used to dissect through the longitudinal esophageal muscle fibers to identify the circular  fibers at the distal esophagus. The circular fibers were divided with cautery. This myotomy was then carried into the thoracic esophagus for about 7 cm. Next, hook cautery was used to take the myotomy onto the anterior stomach wall about 2 cm. Care was taken to keep the anterior vagus lateral to the myotomy.  Next, endoscopy was performed. No bubbles were seen on insufflation. The GE junction was identified. The esophageal myotomy was measured at 7 cm. One area of narrowing was seen in the middle of the myotomy. Additional dissection was used to ensure no remaining fibers were crossing the dissection  The crus was repaired with 1 interrupted 2-0 ethibond sutures showing appropriate sizing of the crus. A toupet was created by bringing the fundus posterior to the esophagus and 3 2-0 silks were used to suture the esophagus to the fundus on the right side. A portion of anterior fundus was then sutured to the esophagus on the left side with 3 2-0 silks.   Hemostasis was inspected and intact. Pneumoperitoneum was removed. All trocars were removed. All incisions were closed with 4-0 monocryl subcuticular suture. Dermabond was placed for dressing. The patient awoke from anesthesia and was brought to pacu in stable condition.   Findings: 7 cm esophageal myotomy, 2 cm gastric myotomy  Specimen: none  Implant: none   Blood loss: 20 ml  Local anesthesia: 50 ml Exparel:Marcaine Mix  Complications: none  Feliciana Rossetti, M.D. General, Bariatric, & Minimally Invasive Surgery [facility]

## 2022-02-22 NOTE — Transfer of Care (Signed)
Immediate Anesthesia Transfer of Care Note  Patient: Tyler Coleman  Procedure(s) Performed: XI ROBOTIC ASSISTED HELLER MYOTOMY WITH PARTIAL FUNDOPLICATION  Patient Location: PACU  Anesthesia Type:General  Level of Consciousness: drowsy  Airway & Oxygen Therapy: Patient Spontanous Breathing and Patient connected to face mask oxygen  Post-op Assessment: Report given to RN and Post -op Vital signs reviewed and stable  Post vital signs: Reviewed and stable  Last Vitals:  Vitals Value Taken Time  BP 162/73 02/22/22 1555  Temp    Pulse 86 02/22/22 1557  Resp 22 02/22/22 1557  SpO2 100 % 02/22/22 1557  Vitals shown include unvalidated device data.  Last Pain:  Vitals:   02/22/22 1159  TempSrc:   PainSc: 7       Patients Stated Pain Goal: 4 (02/22/22 1159)  Complications: No notable events documented.

## 2022-02-22 NOTE — Anesthesia Preprocedure Evaluation (Addendum)
Anesthesia Evaluation  Patient identified by MRN, date of birth, ID band Patient awake    Reviewed: Allergy & Precautions, NPO status , Patient's Chart, lab work & pertinent test results  Airway Mallampati: II  TM Distance: >3 FB Neck ROM: Full    Dental no notable dental hx. (+) Dental Advisory Given, Teeth Intact   Pulmonary sleep apnea , former smoker   Pulmonary exam normal breath sounds clear to auscultation       Cardiovascular hypertension, Pt. on medications Normal cardiovascular exam Rhythm:Regular Rate:Normal     Neuro/Psych  PSYCHIATRIC DISORDERS Anxiety Depression    negative neurological ROS     GI/Hepatic ,GERD  Medicated and Controlled,,(+)     substance abuse    Endo/Other  negative endocrine ROS    Renal/GU negative Renal ROS     Musculoskeletal  (+) Arthritis ,    Abdominal  (+) + obese  Peds  Hematology negative hematology ROS (+)   Anesthesia Other Findings ERECTILE DYSFUNCTION  Reproductive/Obstetrics                             Anesthesia Physical Anesthesia Plan  ASA: 3  Anesthesia Plan: General   Post-op Pain Management: Ofirmev IV (intra-op)*   Induction: Intravenous  PONV Risk Score and Plan: 3 and Ondansetron, Dexamethasone, Midazolam, Treatment may vary due to age or medical condition and Scopolamine patch - Pre-op  Airway Management Planned: Oral ETT  Additional Equipment:   Intra-op Plan:   Post-operative Plan: Extubation in OR  Informed Consent: I have reviewed the patients History and Physical, chart, labs and discussed the procedure including the risks, benefits and alternatives for the proposed anesthesia with the patient or authorized representative who has indicated his/her understanding and acceptance.     Dental advisory given  Plan Discussed with: CRNA  Anesthesia Plan Comments:         Anesthesia Quick Evaluation

## 2022-02-22 NOTE — Anesthesia Postprocedure Evaluation (Signed)
Anesthesia Post Note  Patient: Tyler Coleman  Procedure(s) Performed: XI ROBOTIC ASSISTED HELLER MYOTOMY WITH PARTIAL FUNDOPLICATION     Patient location during evaluation: PACU Anesthesia Type: General Level of consciousness: sedated and patient cooperative Pain management: pain level controlled Vital Signs Assessment: post-procedure vital signs reviewed and stable Respiratory status: spontaneous breathing Cardiovascular status: stable Anesthetic complications: no   No notable events documented.  Last Vitals:  Vitals:   02/22/22 1615 02/22/22 1630  BP: (!) 137/92 139/88  Pulse: 77 77  Resp: 14 15  Temp:    SpO2: 100% 99%    Last Pain:  Vitals:   02/22/22 1629  TempSrc:   PainSc: 8                  Lewie Loron

## 2022-02-22 NOTE — Anesthesia Procedure Notes (Signed)
Procedure Name: Intubation Date/Time: 02/22/2022 1:15 PM  Performed by: Florene Route, CRNAPre-anesthesia Checklist: Patient identified, Emergency Drugs available, Suction available and Patient being monitored Patient Re-evaluated:Patient Re-evaluated prior to induction Oxygen Delivery Method: Circle system utilized Preoxygenation: Pre-oxygenation with 100% oxygen Induction Type: IV induction Ventilation: Mask ventilation without difficulty Laryngoscope Size: Miller and 3 Grade View: Grade I Tube type: Oral Tube size: 8.0 mm Number of attempts: 1 Airway Equipment and Method: Stylet and Oral airway Placement Confirmation: ETT inserted through vocal cords under direct vision, positive ETCO2 and breath sounds checked- equal and bilateral Secured at: 23 cm Tube secured with: Tape Dental Injury: Teeth and Oropharynx as per pre-operative assessment

## 2022-02-23 ENCOUNTER — Other Ambulatory Visit (HOSPITAL_COMMUNITY): Payer: Self-pay

## 2022-02-23 ENCOUNTER — Inpatient Hospital Stay (HOSPITAL_COMMUNITY): Payer: Medicare Other

## 2022-02-23 MED ORDER — OXYCODONE HCL 5 MG/5ML PO SOLN
5.0000 mg | ORAL | 0 refills | Status: DC | PRN
  Filled 2022-02-23: qty 150, 3d supply, fill #0
  Filled 2022-02-23: qty 120, 2d supply, fill #0

## 2022-02-23 MED ORDER — ONDANSETRON 4 MG PO TBDP: 4.0000 mg | ORAL_TABLET | Freq: Four times a day (QID) | ORAL | 0 refills | Status: DC | PRN

## 2022-02-23 MED ORDER — IOHEXOL 300 MG/ML  SOLN
100.0000 mL | Freq: Once | INTRAMUSCULAR | Status: AC | PRN
  Administered 2022-02-23: 100 mL via ORAL

## 2022-02-23 MED ORDER — OXYCODONE HCL 5 MG/5ML PO SOLN
5.0000 mg | ORAL | 0 refills | Status: DC | PRN
  Filled 2022-02-23: qty 150, 3d supply, fill #0

## 2022-02-23 MED ORDER — OXYCODONE HCL 5 MG/5ML PO SOLN: 5.0000 mg | ORAL | 0 refills | Status: DC | PRN

## 2022-02-23 NOTE — Progress Notes (Signed)
Pt very angry and upset about d/c prescriptions. Instructed both pt and his wife several times regarding Oxycodone liquid for pain. Pt cursing at wife in the rm, presenting at the nurses station several times to get clarification of his pain med Rx. Reviewed written d/c instructions w wife and all questions answered. They both verbalized understanding. D/C via w/c w all belongings in stable condition.

## 2022-02-23 NOTE — Discharge Instructions (Signed)
EATING AFTER YOUR ESOPHAGEAL SURGERY (Stomach Fundoplication, Hiatal Hernia repair, Achalasia surgery, etc)  ######################################################################  EAT Start with a pureed / full liquid diet (see below) Gradually transition to a high fiber diet with a fiber supplement over the next month after discharge.    WALK Walk an hour a day.  Control your pain to do that.    CONTROL PAIN Control pain so that you can walk, sleep, tolerate sneezing/coughing, go up/down stairs.  HAVE A BOWEL MOVEMENT DAILY Keep your bowels regular to avoid problems.  OK to try a laxative to override constipation.  OK to use an antidairrheal to slow down diarrhea.  Call if not better after 2 tries  CALL IF YOU HAVE PROBLEMS/CONCERNS Call if you are still struggling despite following these instructions. Call if you have concerns not answered by these instructions  ######################################################################   After your esophageal surgery, expect some sticking with swallowing over the next 1-2 months.    If food sticks when you eat, it is called "dysphagia".  This is due to swelling around your esophagus at the wrap & hiatal diaphragm repair.  It will gradually ease off over the next few months.  To help you through this temporary phase, we start you out on a pureed (blenderized) diet.  Your first meal in the hospital was thin liquids.  You should have been given a pureed diet by the time you left the hospital.  We ask patients to stay on a pureed diet for the first 2-3 weeks to avoid anything getting "stuck" near your recent surgery.  Don't be alarmed if your ability to swallow doesn't progress according to this plan.  Everyone is different and some diets can advance more or less quickly.    It is often helpful to crush your medications or split them as they can sometimes stick, especially the first week or so.   Some BASIC RULES to follow  are:  Maintain an upright position whenever eating or drinking.  Take small bites - just a teaspoon size bite at a time.  Eat slowly.  It may also help to eat only one food at a time.  Consider nibbling through smaller, more frequent meals & avoid the urge to eat BIG meals  Do not push through feelings of fullness, nausea, or bloatedness  Do not mix solid foods and liquids in the same mouthful  Try not to "wash foods down" with large gulps of liquids.  Avoid carbonated (bubbly/fizzy) drinks.    Avoid foods that make you feel gassy or bloated.  Start with bland foods first.  Wait on trying greasy, fried, or spicy meals until you are tolerating more bland solids well.  Understand that it will be hard to burp and belch at first.  This gradually improves with time.  Expect to be more gassy/flatulent/bloated initially.  Walking will help your body manage it better.  Consider using medications for bloating that contain simethicone such as  Maalox or Gas-X   Consider crushing her medications, especially smaller pills.  The ability to swallow pills should get easier after a few weeks  Eat in a relaxed atmosphere & minimize distractions.  Avoid talking while eating.    Do not use straws.  Following each meal, sit in an upright position (90 degree angle) for 60 to 90 minutes.  Going for a short walk can help as well  If food does stick, don't panic.  Try to relax and let the food pass on its own.    Sipping WARM LIQUID such as strong hot black tea can also help slide it down.   Be gradual in changes & use common sense:  -If you easily tolerating a certain "level" of foods, advance to the next level gradually -If you are having trouble swallowing a particular food, then avoid it.   -If food is sticking when you advance your diet, go back to thinner previous diet (the lower LEVEL) for 1-2 days.  LEVEL 1 = PUREED DIET  Do for the first 2 WEEKS AFTER SURGERY  -Foods in this group are  pureed or blenderized to a smooth, mashed potato-like consistency.  -If necessary, the pureed foods can keep their shape with the addition of a thickening agent.   -Meat should be pureed to a smooth, pasty consistency.  Hot broth or gravy may be added to the pureed meat, approximately 1 oz. of liquid per 3 oz. serving of meat. -CAUTION:  If any foods do not puree into a smooth consistency, swallowing will be more difficult.  (For example, nuts or seeds sometimes do not blend well.)  Hot Foods Cold Foods  Pureed scrambled eggs and cheese Pureed cottage cheese  Baby cereals Thickened juices and nectars  Thinned cooked cereals (no lumps) Thickened milk or eggnog  Pureed French toast or pancakes Ensure  Mashed potatoes Ice cream  Pureed parsley, au gratin, scalloped potatoes, candied sweet potatoes Fruit or Italian ice, sherbet  Pureed buttered or alfredo noodles Plain yogurt  Pureed vegetables (no corn or peas) Instant breakfast  Pureed soups and creamed soups Smooth pudding, mousse, custard  Pureed scalloped apples Whipped gelatin  Gravies Sugar, syrup, honey, jelly  Sauces, cheese, tomato, barbecue, white, creamed Cream  Any baby food Creamer  Alcohol in moderation (not beer or champagne) Margarine  Coffee or tea Mayonnaise   Ketchup, mustard   Apple sauce   SAMPLE MENU:  PUREED DIET Breakfast Lunch Dinner   Orange juice, 1/2 cup  Cream of wheat, 1/2 cup  Pineapple juice, 1/2 cup  Pureed turkey, barley soup, 3/4 cup  Pureed Hawaiian chicken, 3 oz   Scrambled eggs, mashed or blended with cheese, 1/2 cup  Tea or coffee, 1 cup   Whole milk, 1 cup   Non-dairy creamer, 2 Tbsp.  Mashed potatoes, 1/2 cup  Pureed cooled broccoli, 1/2 cup  Apple sauce, 1/2 cup  Coffee or tea  Mashed potatoes, 1/2 cup  Pureed spinach, 1/2 cup  Frozen yogurt, 1/2 cup  Tea or coffee      LEVEL 2 = SOFT DIET  After your first 2 weeks, you can advance to a soft diet.   Keep on this  diet until everything goes down easily.  Hot Foods Cold Foods  White fish Cottage cheese  Stuffed fish Junior baby fruit  Baby food meals Semi thickened juices  Minced soft cooked, scrambled, poached eggs nectars  Souffle & omelets Ripe mashed bananas  Cooked cereals Canned fruit, pineapple sauce, milk  potatoes Milkshake  Buttered or Alfredo noodles Custard  Cooked cooled vegetable Puddings, including tapioca  Sherbet Yogurt  Vegetable soup or alphabet soup Fruit ice, Italian ice  Gravies Whipped gelatin  Sugar, syrup, honey, jelly Junior baby desserts  Sauces:  Cheese, creamed, barbecue, tomato, white Cream  Coffee or tea Margarine   SAMPLE MENU:  LEVEL 2 Breakfast Lunch Dinner   Orange juice, 1/2 cup  Oatmeal, 1/2 cup  Scrambled eggs with cheese, 1/2 cup  Decaffeinated tea, 1 cup  Whole milk, 1 cup    Non-dairy creamer, 2 Tbsp  Pineapple juice, 1/2 cup  Minced beef, 3 oz  Gravy, 2 Tbsp  Mashed potatoes, 1/2 cup  Minced fresh broccoli, 1/2 cup  Applesauce, 1/2 cup  Coffee, 1 cup  Turkey, barley soup, 3/4 cup  Minced Hawaiian chicken, 3 oz  Mashed potatoes, 1/2 cup  Cooked spinach, 1/2 cup  Frozen yogurt, 1/2 cup  Non-dairy creamer, 2 Tbsp      LEVEL 3 = CHOPPED DIET  -After all the foods in level 2 (soft diet) are passing through well you should advance up to more chopped foods.  -It is still important to cut these foods into small pieces and eat slowly.  Hot Foods Cold Foods  Poultry Cottage cheese  Chopped Swedish meatballs Yogurt  Meat salads (ground or flaked meat) Milk  Flaked fish (tuna) Milkshakes  Poached or scrambled eggs Soft, cold, dry cereal  Souffles and omelets Fruit juices or nectars  Cooked cereals Chopped canned fruit  Chopped French toast or pancakes Canned fruit cocktail  Noodles or pasta (no rice) Pudding, mousse, custard  Cooked vegetables (no frozen peas, corn, or mixed vegetables) Green salad  Canned small sweet peas  Ice cream  Creamed soup or vegetable soup Fruit ice, Italian ice  Pureed vegetable soup or alphabet soup Non-dairy creamer  Ground scalloped apples Margarine  Gravies Mayonnaise  Sauces:  Cheese, creamed, barbecue, tomato, white Ketchup  Coffee or tea Mustard   SAMPLE MENU:  LEVEL 3 Breakfast Lunch Dinner   Orange juice, 1/2 cup  Oatmeal, 1/2 cup  Scrambled eggs with cheese, 1/2 cup  Decaffeinated tea, 1 cup  Whole milk, 1 cup  Non-dairy creamer, 2 Tbsp  Ketchup, 1 Tbsp  Margarine, 1 tsp  Salt, 1/4 tsp  Sugar, 2 tsp  Pineapple juice, 1/2 cup  Ground beef, 3 oz  Gravy, 2 Tbsp  Mashed potatoes, 1/2 cup  Cooked spinach, 1/2 cup  Applesauce, 1/2 cup  Decaffeinated coffee  Whole milk  Non-dairy creamer, 2 Tbsp  Margarine, 1 tsp  Salt, 1/4 tsp  Pureed turkey, barley soup, 3/4 cup  Barbecue chicken, 3 oz  Mashed potatoes, 1/2 cup  Ground fresh broccoli, 1/2 cup  Frozen yogurt, 1/2 cup  Decaffeinated tea, 1 cup  Non-dairy creamer, 2 Tbsp  Margarine, 1 tsp  Salt, 1/4 tsp  Sugar, 1 tsp    LEVEL 4:  REGULAR FOODS  -Foods in this group are soft, moist, regularly textured foods.   -This level includes meat and breads, which tend to be the hardest things to swallow.   -Eat very slowly, chew well and continue to avoid carbonated drinks. -most people are at this level in 4-6 weeks  Hot Foods Cold Foods  Baked fish or skinned Soft cheeses - cottage cheese  Souffles and omelets Cream cheese  Eggs Yogurt  Stuffed shells Milk  Spaghetti with meat sauce Milkshakes  Cooked cereal Cold dry cereals (no nuts, dried fruit, coconut)  French toast or pancakes Crackers  Buttered toast Fruit juices or nectars  Noodles or pasta (no rice) Canned fruit  Potatoes (all types) Ripe bananas  Soft, cooked vegetables (no corn, lima, or baked beans) Peeled, ripe, fresh fruit  Creamed soups or vegetable soup Cakes (no nuts, dried fruit, coconut)  Canned chicken  noodle soup Plain doughnuts  Gravies Ice cream  Bacon dressing Pudding, mousse, custard  Sauces:  Cheese, creamed, barbecue, tomato, white Fruit ice, Italian ice, sherbet  Decaffeinated tea or coffee Whipped gelatin  Pork chops Regular gelatin     Canned fruited gelatin molds   Sugar, syrup, honey, jam, jelly   Cream   Non-dairy   Margarine   Oil   Mayonnaise   Ketchup   Mustard   TROUBLESHOOTING IRREGULAR BOWELS  1) Avoid extremes of bowel movements (no bad constipation/diarrhea)  2) Miralax 17gm mixed in 8oz. water or juice-daily. May use BID as needed.  3) Gas-x,Phazyme, etc. as needed for gas & bloating.  4) Soft,bland diet. No spicy,greasy,fried foods.  5) Prilosec over-the-counter as needed  6) May hold gluten/wheat products from diet to see if symptoms improve.  7) May try probiotics (Align, Activa, etc) to help calm the bowels down  7) If symptoms become worse call back immediately.    If you have any questions please call our office at [facility]: [phone].  

## 2022-02-23 NOTE — Discharge Summary (Signed)
Physician Discharge Summary  Tyler Coleman:[id number] DOB: May 26, 1955 DOA: 02/22/2022  PCP: [place], [place] date: 02/22/2022 Discharge date: 02/23/2022  Recommendations for Outpatient Follow-up:     Follow-up Information     Rayane Gallardo, Arta Bruce, MD Follow up on 03/25/2022.   Specialty: General Surgery Contact information: [address] [place] [address] [phone]                Discharge Diagnoses:  Principal Problem:   Achalasia   Surgical Procedure: robotic heller myotomy with toupet fundoplication  Discharge Condition: Good Disposition: Home  Diet recommendation: full liquid diet for 2 weeks then soft diet   Hospital Course:  He presented for achalasia and underwent heller myotomy with toupet fundoplication. Postoperatively he was able to tolerate liquids. He initially had significant pain radiating to the shoulder than improved POD 1. He underwent UGI which showed good passage and was discharged home POD 1.  Discharge Instructions  Discharge Instructions     Diet full liquid   Complete by: As directed    Increase activity slowly   Complete by: As directed       Allergies as of 02/23/2022       Reactions   Augmentin [amoxicillin-pot Clavulanate] Other (See Comments)   Per pt severe burning of throat , "it got stuck" does not want it again   Doxycycline    Unknown reaction per pcp note   Erythromycin    Unknown reaction per pcp note   Thorazine [chlorpromazine] Swelling   "My throat swelled"        Medication List     STOP taking these medications    acetaminophen 325 MG tablet Commonly known as: TYLENOL   famotidine 20 MG tablet Commonly known as: PEPCID   Oxycodone HCl 10 MG Tabs Replaced by: oxyCODONE 5 MG/5ML solution       TAKE these medications    benazepril 10 MG tablet Commonly known as: LOTENSIN Take 10 mg by mouth daily.   celecoxib 200 MG  capsule Commonly known as: CELEBREX Take 1 capsule (200 mg total) by mouth 2 (two) times daily.   citalopram 20 MG tablet Commonly known as: CELEXA Take 30 mg by mouth daily.   clonazePAM 0.5 MG tablet Commonly known as: KLONOPIN Take 0.5 mg by mouth 3 (three) times daily.   Depo-Testosterone 200 MG/ML injection Generic drug: testosterone cypionate Inject 60 mg into the muscle every Wednesday.   fluticasone 50 MCG/ACT nasal spray Commonly known as: FLONASE Place 1 spray into both nostrils daily as needed for allergies.   ibuprofen 200 MG tablet Commonly known as: ADVIL Take 600 mg by mouth every 6 (six) hours as needed for moderate pain.   Linzess 290 MCG Caps capsule Generic drug: linaclotide Take 290 mcg by mouth daily as needed (constipation).   Melatonin 10 MG Tabs Take 10 mg by mouth at bedtime as needed (sleep).   MIRALAX PO Take 17 g by mouth every other day. Alternates with metamucil   mupirocin ointment 2 % Commonly known as: BACTROBAN Apply 1 Application topically as needed (itching on legs).   Narcan 4 MG/0.1ML Liqd nasal spray kit Generic drug: naloxone Place 1 spray into the nose once.   ondansetron 4 MG disintegrating tablet Commonly known as: ZOFRAN-ODT Take 1 tablet (4 mg total) by mouth every 6 (six) hours as needed for nausea.   oxyCODONE 5 MG/5ML solution Commonly known as: ROXICODONE Take 5-10 mLs (5-10 mg  total) by mouth every 4 (four) hours as needed for severe pain. Replaces: Oxycodone HCl 10 MG Tabs   pantoprazole 40 MG tablet Commonly known as: PROTONIX Take 80 mg by mouth daily.   psyllium 58.6 % powder Commonly known as: METAMUCIL Take 1 packet by mouth every other day. Per pt alternates w/ miralax , takes at night   ROLAIDS PO Take 1 tablet by mouth daily as needed (heartburn).   sodium chloride 0.65 % Soln nasal spray Commonly known as: OCEAN Place 1 spray into both nostrils as needed for congestion.   Suboxone 12-3 MG  Film Generic drug: Buprenorphine HCl-Naloxone HCl Place 1 Film under the tongue daily as needed (feeling withdrawal symptoms).   traZODone 50 MG tablet Commonly known as: DESYREL Take 50 mg by mouth at bedtime.        Follow-up Information     Leor Whyte, Arta Bruce, MD Follow up on 03/25/2022.   Specialty: General Surgery Contact information: [address] [place] [address] [phone]                  The results of significant diagnostics from this hospitalization (including imaging, microbiology, ancillary and laboratory) are listed below for reference.    Significant Diagnostic Studies: DG UGI W SINGLE CM (SOL OR THIN BA)  Result Date: 02/23/2022 CLINICAL DATA:  Hiatal hernia repair yesterday. EXAM: WATER SOLUBLE UPPER GI SERIES TECHNIQUE: Single-column upper GI series was performed using water soluble contrast. Radiation Exposure Index (as provided by the fluoroscopic device): 43.6 mGy CONTRAST:  100 cc Omnipaque 300. COMPARISON:  None Available. FLUOROSCOPY: Fluoroscopy Time:  1 minute 6 seconds Radiation Exposure Index (if provided by the fluoroscopic device): 43.6 mGy Number of Acquired Spot Images: 6 FINDINGS: Scout view of the abdomen shows mild gaseous distension of sigmoid colon. No small bowel dilatation. No unexpected radiopaque calculi. Gas is seen in the rectum. Water-soluble contrast examination shows hiatal hernia repair. No evidence of a leak. IMPRESSION: No evidence of a leak. Electronically Signed   By: Lorin Picket M.D.   On: 02/23/2022 08:47   DG Ribs Unilateral W/Chest Left  Result Date: 02/08/2022 CLINICAL DATA:  66 year old male with left lower chest wall pain, headache. Drug use yesterday. EXAM: LEFT RIBS AND CHEST - 3+ VIEW COMPARISON:  CTA chest 11/12/2019 and earlier. FINDINGS: Lung volumes and mediastinal contours are normal. Stable lung volumes. Both lungs appear clear. No pneumothorax or pleural effusion. Minimally  displaced fracture of the left anterolateral 8th through 11th ribs appears acute or subacute (little to no healing is apparent). Background bone mineralization within normal limits. Other left side ribs appear intact. No other No acute osseous abnormality identified. Negative visible bowel gas. IMPRESSION: 1. Fractures with minimal to mild displacement of the left anterolateral 8th through 11th ribs appear acute or subacute. 2. No acute cardiopulmonary abnormality. Electronically Signed   By: Genevie Ann M.D.   On: 02/08/2022 09:07    Labs: Basic Metabolic Panel: Recent Labs  Lab 02/17/22 1427 02/22/22 1734  NA 137  --   K 3.7  --   CL 102  --   CO2 27  --   GLUCOSE 98  --   BUN 12  --   CREATININE 0.81 0.94  CALCIUM 8.7*  --    Liver Function Tests: Recent Labs  Lab 02/17/22 1427  AST 27  ALT 22  ALKPHOS 77  BILITOT 0.7  PROT 6.5  ALBUMIN 3.7    CBC: Recent  Labs  Lab 02/17/22 1420 02/22/22 1734  WBC 6.5 11.3*  HGB 13.8 14.8  HCT 43.6 46.7  MCV 83.0 84.3  PLT 192 199    CBG: No results for input(s): "GLUCAP" in the last 168 hours.  Principal Problem:   Achalasia   Time coordinating discharge: 15 min

## 2022-03-19 IMAGING — CT CT ANGIO CHEST
1 of 2 series · 18 of 32 positions shown · IV contrast (APPLIED)
Comparison: Chest CT January 16, 2010

CLINICAL DATA: Cough and hemoptysis

EXAM:
CT ANGIOGRAPHY CHEST WITH CONTRAST
TECHNIQUE: Multidetector CT imaging of the chest was performed using the
standard protocol during bolus administration of intravenous
contrast. Multiplanar CT image reconstructions and MIPs were
obtained to evaluate the vascular anatomy.
CONTRAST:  75mL HDYMUV-LSA IOPAMIDOL (HDYMUV-LSA) INJECTION 76%
Creatinine 0.8; GFR 93

[Series 5: thins 1.0 b31s · axial · 0.79mm/px · z∈[-385,-60]mm · 18 of 363 slices shown]
[im 19/363  lung]
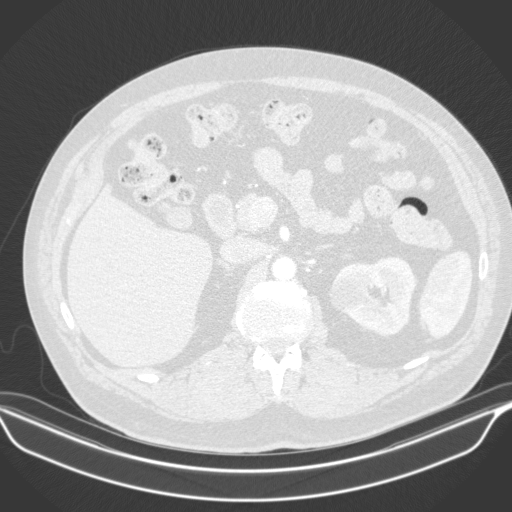
[im 37/363  mediastinal]
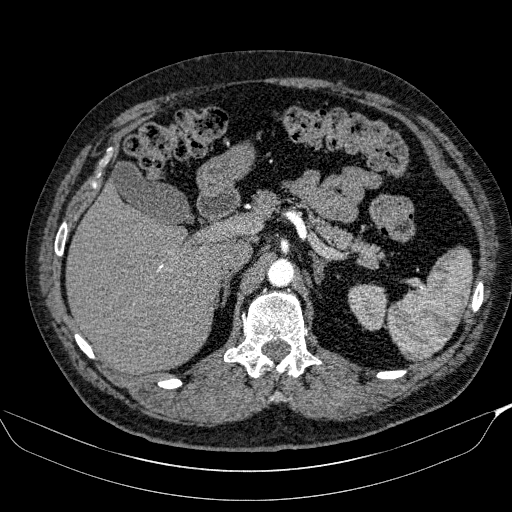
[im 73/363  lung]
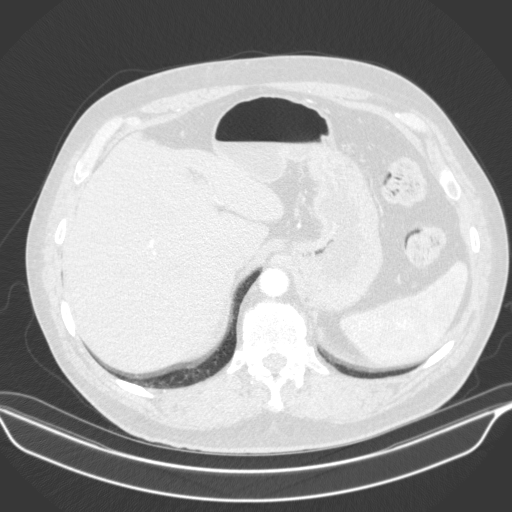
[im 91/363  mediastinal]
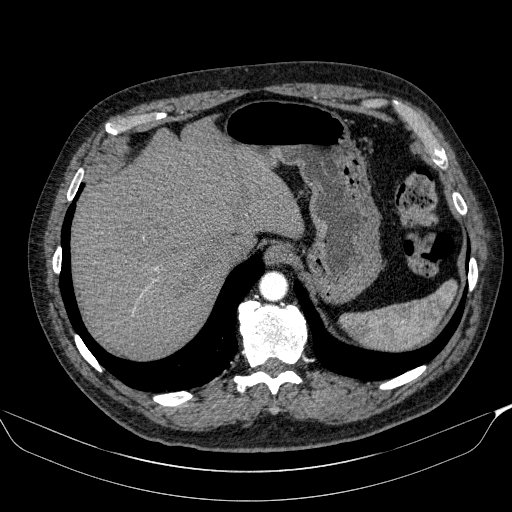
[im 109/363  lung]
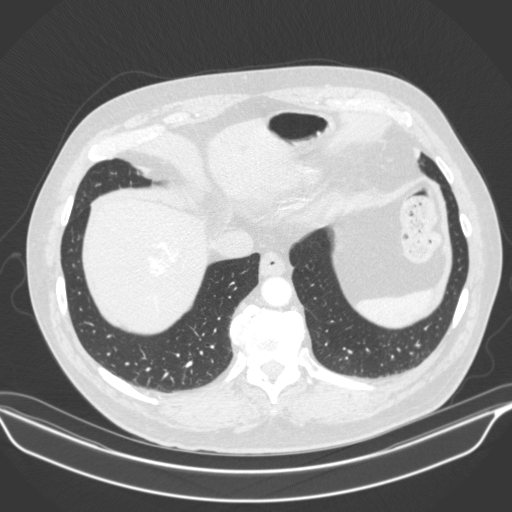
[im 121/363  mediastinal]
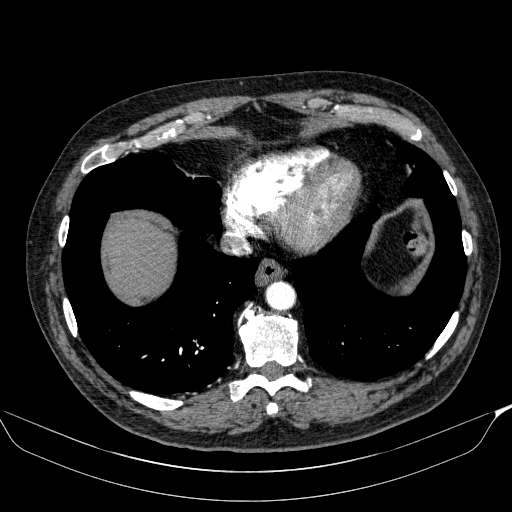
[im 127/363  lung]
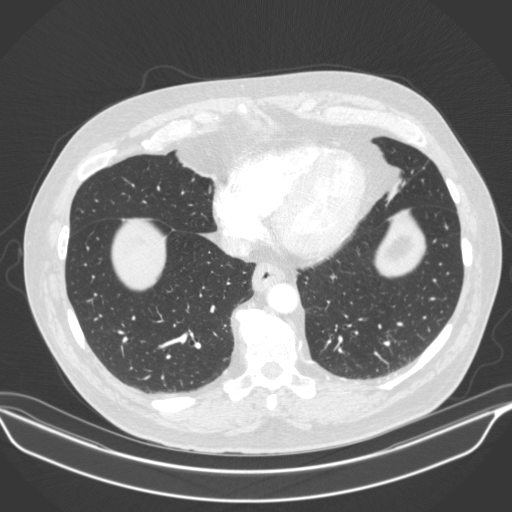
[im 163/363  mediastinal]
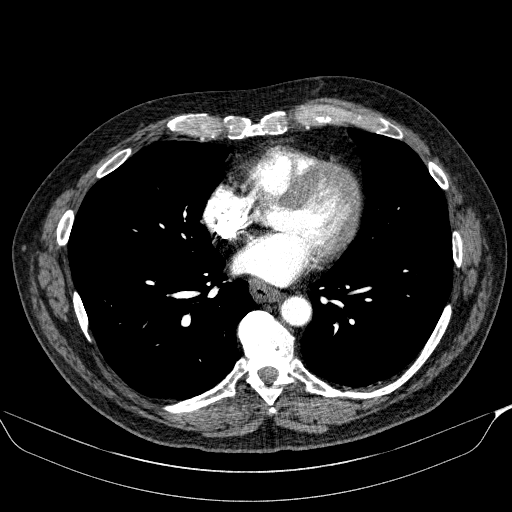
[im 172/363  lung]
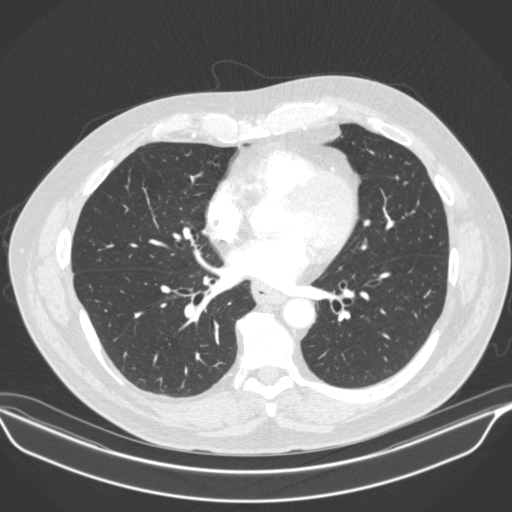
[im 182/363  mediastinal]
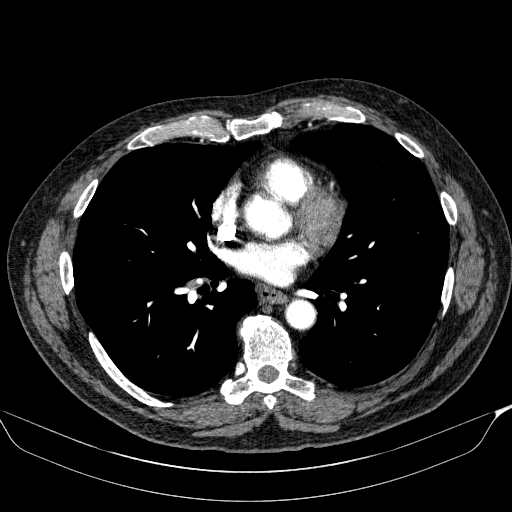
[im 200/363  lung]
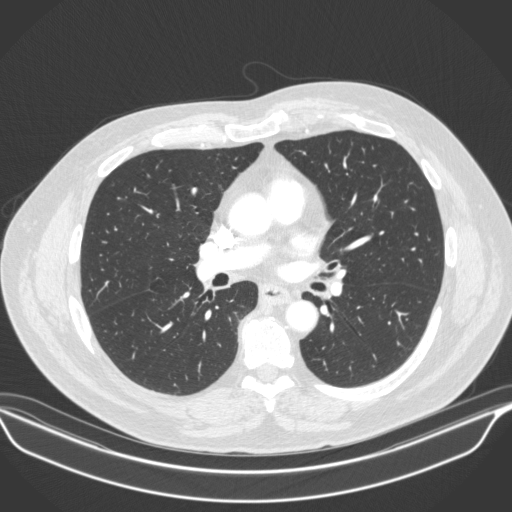
[im 236/363  mediastinal]
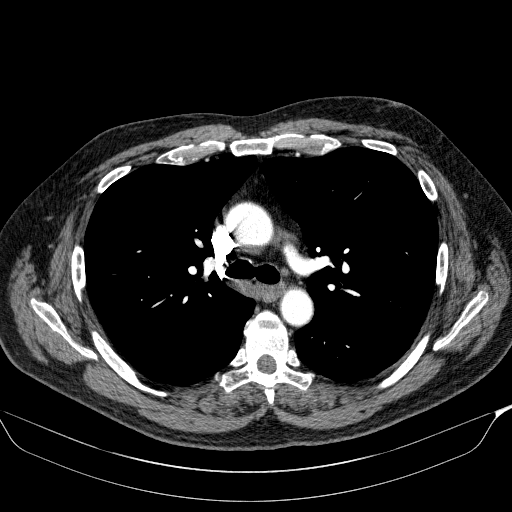
[im 242/363  lung]
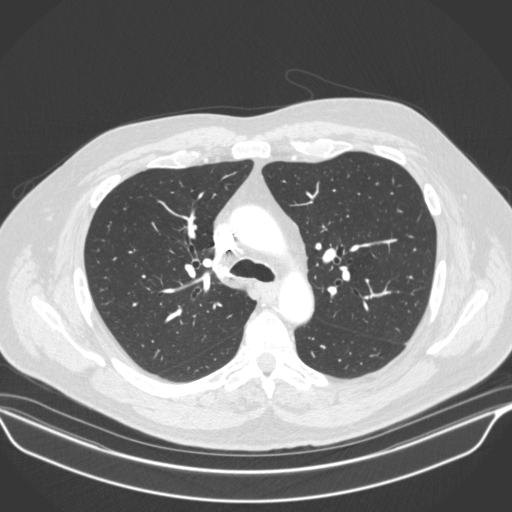
[im 254/363  mediastinal]
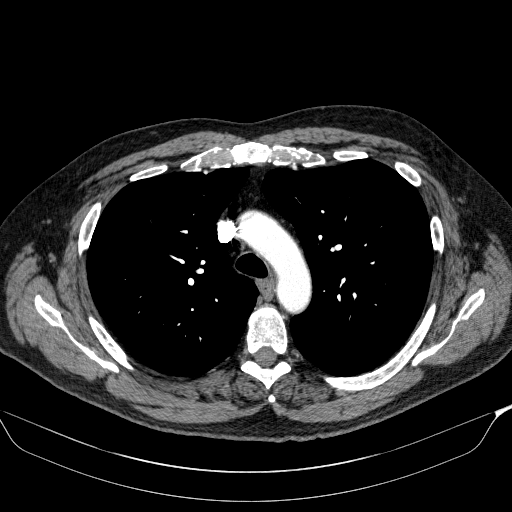
[im 272/363  lung]
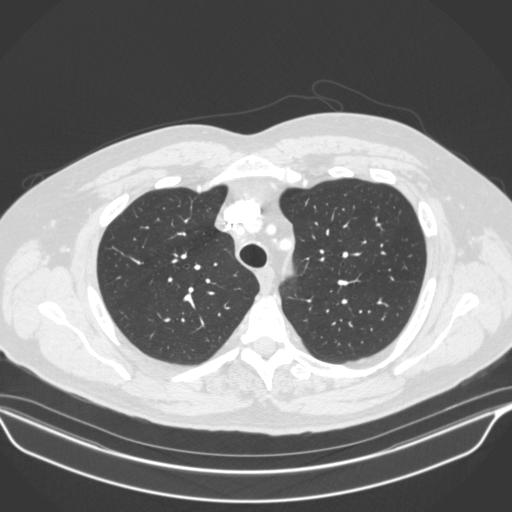
[im 290/363  mediastinal]
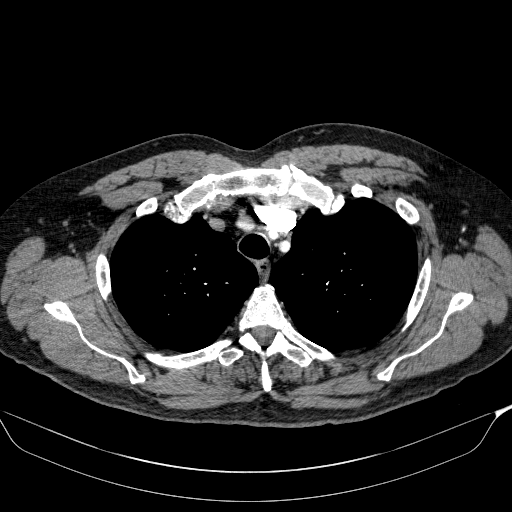
[im 326/363  lung]
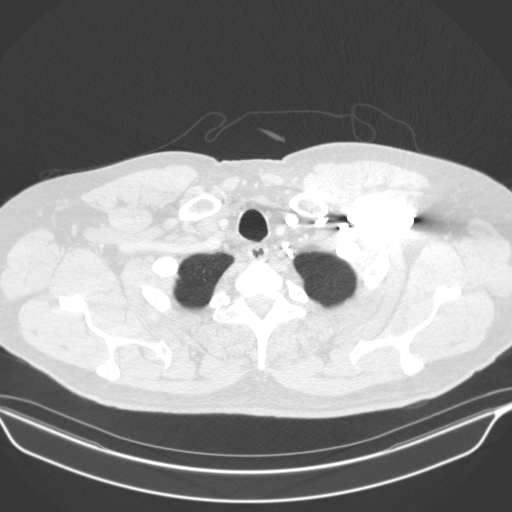
[im 344/363  mediastinal]
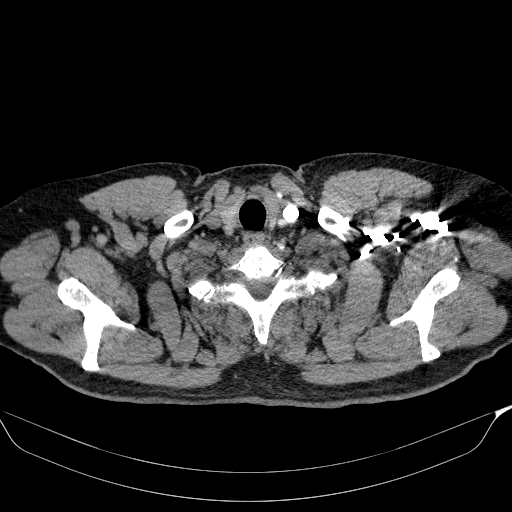

[18 of 32 positions shown; findings below may reference images not displayed]

FINDINGS: Cardiovascular: There is no demonstrable pulmonary embolus. There is
no appreciable thoracic aortic aneurysm or dissection. Visualized
great vessels appear unremarkable. Note that the left vertebral
artery arises directly off the arch, an anatomic variant. No
appreciable pericardial effusion or pericardial thickening evident.

Mediastinum/Nodes: Visualized thyroid appears unremarkable. There is
no appreciable thoracic adenopathy. There is thickening of the wall
of the esophagus throughout much of its course.

Lungs/Pleura: There is no edema or airspace opacity. There are
scattered 1-2 mm granulomas in the lingular region. There is a 2 mm
nodular opacity in the anterior segment left upper lobe seen on
axial slice 91 series 7, stable from the 6599 study. There is mild
bibasilar atelectasis. No pleural effusions.

Upper Abdomen: There is a hemangioma in the dome of the liver on the
right measuring 2.9 x 2.7 cm, also present on previous study.
Subcentimeter cyst noted near the dome of the liver. There is a left
adrenal adenoma measuring 1.6 x 1.2 cm, present on previous study.
There is a cyst arising from the upper pole of the left kidney
measuring 3.3 x 2.7 cm. There is a 1 cm cyst in the left kidney as
well.

Musculoskeletal: There are foci of degenerative change in the
thoracic spine. No blastic or lytic bone lesions. No evident chest
wall lesions.

Review of the MIP images confirms the above findings.
IMPRESSION: 1. No evident pulmonary embolus. No thoracic aortic aneurysm or
dissection.

2. Areas of mild atelectatic change. Evidence of prior granulomatous
disease. 2 mm nodular opacity in the left upper lobe, stable from
6599.

3.  No evident adenopathy.

4. Diffuse esophageal wall thickening. Question a degree of
underlying esophagitis.

5.  Stable hemangioma in the dome of the liver on the right.

6.  Stable left adrenal adenoma.

## 2022-09-12 ENCOUNTER — Telehealth: Payer: Self-pay

## 2022-09-12 NOTE — Telephone Encounter (Signed)
Patient came by and wanted to know if Dr. Para March could take him on a new patient. He stated that his brother Arlene Genova sees him and would love to establish with [facility] since its close to home. Thank you!

## 2022-09-13 NOTE — Telephone Encounter (Signed)
Spoke with patient wife, she will have him call back to schedule. Thank you!

## 2022-09-13 NOTE — Telephone Encounter (Signed)
Please schedule when possible, later this summer.  Thanks.

## 2022-09-19 NOTE — Telephone Encounter (Signed)
Patient contacted the office regarding this, states he worked something out with his current provider and he is going to stray where he is. States he will call if he ever needs to switch again.

## 2022-10-06 ENCOUNTER — Other Ambulatory Visit: Payer: Self-pay | Admitting: Physical Medicine and Rehabilitation

## 2022-10-06 DIAGNOSIS — M5459 Other low back pain: Secondary | ICD-10-CM

## 2022-10-09 ENCOUNTER — Encounter (HOSPITAL_BASED_OUTPATIENT_CLINIC_OR_DEPARTMENT_OTHER): Payer: Self-pay | Admitting: *Deleted

## 2022-10-09 ENCOUNTER — Emergency Department (HOSPITAL_BASED_OUTPATIENT_CLINIC_OR_DEPARTMENT_OTHER)
Admission: EM | Admit: 2022-10-09 | Discharge: 2022-10-09 | Disposition: A | Discharge status: Home / Self Care | Payer: Medicare Other | Attending: Emergency Medicine | Admitting: Emergency Medicine

## 2022-10-09 ENCOUNTER — Other Ambulatory Visit: Payer: Self-pay

## 2022-10-09 DIAGNOSIS — R300 Dysuria: Secondary | ICD-10-CM | POA: Diagnosis present

## 2022-10-09 DIAGNOSIS — N390 Urinary tract infection, site not specified: Secondary | ICD-10-CM | POA: Insufficient documentation

## 2022-10-09 LAB — URINALYSIS, ROUTINE W REFLEX MICROSCOPIC
Bilirubin Urine: NEGATIVE
Glucose, UA: NEGATIVE mg/dL
Ketones, ur: NEGATIVE mg/dL
Nitrite: POSITIVE — AB
Protein, ur: 100 mg/dL — AB
RBC / HPF: >50 RBC/hpf (ref 0–5)
Specific Gravity, Urine: 1.016 (ref 1.005–1.030)
WBC, UA: >50 WBC/hpf (ref 0–5)
pH: 7 (ref 5.0–8.0)

## 2022-10-09 MED ORDER — CEFDINIR 300 MG PO CAPS: 300.0000 mg | ORAL_CAPSULE | Freq: Two times a day (BID) | ORAL | 0 refills | Status: AC

## 2022-10-09 NOTE — ED Notes (Signed)
EDP into room 

## 2022-10-09 NOTE — ED Triage Notes (Signed)
Here by POV from home for continued recurrent UTI sx, including: dysuria, urgency, some retention, hesitancy, incomplete bladder emptying, change in stream. Denies fever, dizziness, NVD. Has finished 2 rounds of ABT. Unsure of prostate issues. Pain only with urination. Pt is HOH.

## 2022-10-09 NOTE — ED Provider Notes (Signed)
[place] EMERGENCY DEPARTMENT AT [facility] Provider Note   CSN: [id number] Arrival date & time: 10/09/22  4098     History  Chief Complaint  Patient presents with   Dysuria    Tyler Coleman is a 67 y.o. male.  67 year old male presents with dysuria and urgency x 24 hours.  History of multiple UTIs.  States his last UTI was several months ago.  Denies any fever, flank pain.  No nausea or vomiting.  No penile drainage or discharge.  No treatment use prior to arrival       Home Medications Prior to Admission medications   Medication Sig Start Date End Date Taking? Authorizing Provider  benazepril (LOTENSIN) 10 MG tablet Take 10 mg by mouth daily.    [provider]  Buprenorphine HCl-Naloxone HCl (SUBOXONE) 12-3 MG FILM Place 1 Film under the tongue daily as needed (feeling withdrawal symptoms).    [provider]  Ca Carbonate-Mag Hydroxide (ROLAIDS PO) Take 1 tablet by mouth daily as needed (heartburn).    [provider]  celecoxib (CELEBREX) 200 MG capsule Take 1 capsule (200 mg total) by mouth 2 (two) times daily. Patient not taking: Reported on 02/16/2022 12/23/21   Despina Arias, MD  citalopram (CELEXA) 20 MG tablet Take 30 mg by mouth daily.    [provider]  clonazePAM (KLONOPIN) 0.5 MG tablet Take 0.5 mg by mouth 3 (three) times daily.    [provider]  fluticasone (FLONASE) 50 MCG/ACT nasal spray Place 1 spray into both nostrils daily as needed for allergies.    [provider]  ibuprofen (ADVIL) 200 MG tablet Take 600 mg by mouth every 6 (six) hours as needed for moderate pain.    [provider]  linaclotide (LINZESS) 290 MCG CAPS capsule Take 290 mcg by mouth daily as needed (constipation).    [provider]  Melatonin 10 MG TABS Take 10 mg by mouth at bedtime as needed (sleep).    [provider]  mupirocin ointment (BACTROBAN) 2 % Apply 1 Application topically  as needed (itching on legs).    [provider]  naloxone [facility]) nasal spray 4 mg/0.1 mL Place 1 spray into the nose once.    [provider]  ondansetron (ZOFRAN-ODT) 4 MG disintegrating tablet Take 1 tablet (4 mg total) by mouth every 6 (six) hours as needed for nausea. 02/23/22   Kinsinger, De Blanch, MD  oxyCODONE (ROXICODONE) 5 MG/5ML solution Take 5-10 mLs (5-10 mg total) by mouth every 4 (four) hours as needed for severe pain. 02/23/22   Kinsinger, De Blanch, MD  oxyCODONE (ROXICODONE) 5 MG/5ML solution Take 5-10 mLs (5-10 mg total) by mouth every 4 (four) hours as needed form severe pain 02/23/22     pantoprazole (PROTONIX) 40 MG tablet Take 80 mg by mouth daily.    [provider]  Polyethylene Glycol 3350 (MIRALAX PO) Take 17 g by mouth every other day. Alternates with metamucil    [provider]  psyllium (METAMUCIL) 58.6 % powder Take 1 packet by mouth every other day. Per pt alternates w/ miralax , takes at night    [provider]  sodium chloride (OCEAN) 0.65 % SOLN nasal spray Place 1 spray into both nostrils as needed for congestion.    [provider]  testosterone cypionate (DEPO-TESTOSTERONE) 200 MG/ML injection Inject 60 mg into the muscle every Wednesday.    [provider]  traZODone (DESYREL) 50 MG tablet Take 50  mg by mouth at bedtime. 02/16/22   [provider]      Allergies    Augmentin [amoxicillin-pot clavulanate], Doxycycline, Erythromycin, and Thorazine [chlorpromazine]    Review of Systems   Review of Systems  All other systems reviewed and are negative.   Physical Exam Updated Vital Signs BP (!) 147/78   Pulse 78   Temp 98.7 F (37.1 C)   Resp 18   Ht 1.829 m (6')   Wt 95.3 kg   SpO2 93%   BMI 28.48 kg/m  Physical Exam Vitals and nursing note reviewed.  Constitutional:      General: He is not in acute distress.    Appearance: Normal appearance. He is well-developed. He is  not toxic-appearing.  HENT:     Head: Normocephalic and atraumatic.  Eyes:     General: Lids are normal.     Conjunctiva/sclera: Conjunctivae normal.     Pupils: Pupils are equal, round, and reactive to light.  Neck:     Thyroid: No thyroid mass.     Trachea: No tracheal deviation.  Cardiovascular:     Rate and Rhythm: Normal rate and regular rhythm.     Heart sounds: Normal heart sounds. No murmur heard.    No gallop.  Pulmonary:     Effort: Pulmonary effort is normal. No respiratory distress.     Breath sounds: Normal breath sounds. No stridor. No decreased breath sounds, wheezing, rhonchi or rales.  Abdominal:     General: There is no distension.     Palpations: Abdomen is soft.     Tenderness: There is no abdominal tenderness. There is no rebound.  Musculoskeletal:        General: No tenderness. Normal range of motion.     Cervical back: Normal range of motion and neck supple.  Skin:    General: Skin is warm and dry.     Findings: No abrasion or rash.  Neurological:     Mental Status: He is alert and oriented to person, place, and time. Mental status is at baseline.     GCS: GCS eye subscore is 4. GCS verbal subscore is 5. GCS motor subscore is 6.     Cranial Nerves: Cranial nerves are intact. No cranial nerve deficit.     Sensory: No sensory deficit.     Motor: Motor function is intact.  Psychiatric:        Attention and Perception: Attention normal.        Speech: Speech normal.        Behavior: Behavior normal.     ED Results / Procedures / Treatments   Labs (all labs ordered are listed, but only abnormal results are displayed) Labs Reviewed  URINALYSIS, ROUTINE W REFLEX MICROSCOPIC    EKG None  Radiology No results found.  Procedures Procedures    Medications Ordered in ED Medications - No data to display  ED Course/ Medical Decision Making/ A&P                             Medical Decision Making Amount and/or Complexity of Data Reviewed Labs:  ordered.   Patient is urinalysis consistent with infection.  Will place on antibiotics.  He will follow-up with his doctor        Final Clinical Impression(s) / ED Diagnoses Final diagnoses:  None    Rx / DC Orders ED Discharge Orders     None  Lorre Nick, MD 10/09/22 [phone]

## 2022-11-07 ENCOUNTER — Other Ambulatory Visit: Payer: Self-pay | Admitting: Urology

## 2022-11-28 NOTE — Patient Instructions (Signed)
SURGICAL WAITING ROOM VISITATION  Patients having surgery or a procedure may have no more than 2 support people in the waiting area - these visitors may rotate.    Children under the age of 17 must have an adult with them who is not the patient.  Due to an increase in RSV and influenza rates and associated hospitalizations, children ages 64 and under may not visit patients in [facility] hospitals.  If the patient needs to stay at the hospital during part of their recovery, the visitor guidelines for inpatient rooms apply. Pre-op nurse will coordinate an appropriate time for 1 support person to accompany patient in pre-op.  This support person may not rotate.    Please refer to the [facility] website for the visitor guidelines for Inpatients (after your surgery is over and you are in a regular room).       Your procedure is scheduled on: 12/13/22   Report to [facility] Main Entrance    Report to admitting at  7:45 AM   Call this number if you have problems the morning of surgery [phone]   Do not eat food  or drink liquids:After Midnight.        Oral Hygiene is also important to reduce your risk of infection.                                    Remember - BRUSH YOUR TEETH THE MORNING OF SURGERY WITH YOUR REGULAR TOOTHPASTE  DENTURES WILL BE REMOVED PRIOR TO SURGERY PLEASE DO NOT APPLY "Poly grip" OR ADHESIVES!!!   Do NOT smoke after Midnight   Stop all vitamins and herbal supplements 7 days before surgery.   Take these medicines the morning of surgery with A SIP OF WATER:  Celexa, Clonazepam Linzess, Pantoprazole, Oxycodone if needed.               HOLD Suboxone 3 days prior to surgery             You may not have any metal on your body including hair pins, jewelry, and body piercing             Do not wear lotions, powders,cologne, or deodorant              Men may shave face and neck.   Do not bring valuables to the hospital.  IS NOT              RESPONSIBLE   FOR VALUABLES.   Contacts, glasses, dentures or bridgework may not be worn into surgery.   Bring small overnight bag day of surgery.   DO NOT BRING YOUR HOME MEDICATIONS TO THE HOSPITAL. PHARMACY WILL DISPENSE MEDICATIONS LISTED ON YOUR MEDICATION LIST TO YOU DURING YOUR ADMISSION IN THE HOSPITAL!    Patients discharged on the day of surgery will not be allowed to drive home.  Someone NEEDS to stay with you for the first 24 hours after anesthesia.   Special Instructions: Bring a copy of your healthcare power of attorney and living will documents the day of surgery if you haven't scanned them before.              Please read over the following fact sheets you were given: IF YOU HAVE QUESTIONS ABOUT YOUR PRE-OP INSTRUCTIONS PLEASE CALL [phone] Rosey Bath   If you received a COVID test during your pre-op visit  it  is requested that you wear a mask when out in public, stay away from anyone that may not be feeling well and notify your surgeon if you develop symptoms. If you test positive for Covid or have been in contact with anyone that has tested positive in the last 10 days please notify you surgeon.    [place] - Preparing for Surgery Before surgery, you can play an important role.  Because skin is not sterile, your skin needs to be as free of germs as possible.  You can reduce the number of germs on your skin by washing with CHG (chlorahexidine gluconate) soap before surgery.  CHG is an antiseptic cleaner which kills germs and bonds with the skin to continue killing germs even after washing. Please DO NOT use if you have an allergy to CHG or antibacterial soaps.  If your skin becomes reddened/irritated stop using the CHG and inform your nurse when you arrive at Short Stay. Do not shave (including legs and underarms) for at least 48 hours prior to the first CHG shower.  You may shave your face/neck.  Please follow these instructions carefully:  1.  Shower with CHG Soap the  night before surgery and the  morning of surgery.  2.  If you choose to wash your hair, wash your hair first as usual with your normal  shampoo.  3.  After you shampoo, rinse your hair and body thoroughly to remove the shampoo.                             4.  Use CHG as you would any other liquid soap.  You can apply chg directly to the skin and wash.  Gently with a scrungie or clean washcloth.  5.  Apply the CHG Soap to your body ONLY FROM THE NECK DOWN.   Do   not use on face/ open                           Wound or open sores. Avoid contact with eyes, ears mouth and   genitals (private parts).                       Wash face,  Genitals (private parts) with your normal soap.             6.  Wash thoroughly, paying special attention to the area where your    surgery  will be performed.  7.  Thoroughly rinse your body with warm water from the neck down.  8.  DO NOT shower/wash with your normal soap after using and rinsing off the CHG Soap.                9.  Pat yourself dry with a clean towel.            10.  Wear clean pajamas.            11.  Place clean sheets on your bed the night of your first shower and do not  sleep with pets. Day of Surgery : Do not apply any lotions/deodorants the morning of surgery.  Please wear clean clothes to the hospital/surgery center.  FAILURE TO FOLLOW THESE INSTRUCTIONS MAY RESULT IN THE CANCELLATION OF YOUR SURGERY  PATIENT SIGNATURE_________________________________  NURSE SIGNATURE__________________________________  ________________________________________________________________________

## 2022-11-28 NOTE — Progress Notes (Signed)
COVID Vaccine received:  []  No [x]  Yes Date of any COVID positive Test in last 90 days:  PCP - [facility]- [place] Cardiologist -   Chest x-ray -  EKG -  02/08/22 Epic Stress Test -  ECHO -  Cardiac Cath -   Bowel Prep - []  No  []   Yes ______  Pacemaker / ICD device []  No []  Yes   Spinal Cord Stimulator:[]  No []  Yes       History of Sleep Apnea? []  No []  Yes   CPAP used?- []  No []  Yes    Does the patient monitor blood sugar?          []  No []  Yes  []  N/A  Patient has: []  NO Hx DM   []  Pre-DM                 []  DM1  []   DM2 Does patient have a [employer] or [employer]? []  No []  Yes   Fasting Blood Sugar Ranges-  Checks Blood Sugar _____ times a day  GLP1 agonist / usual dose -  GLP1 instructions:  SGLT-2 inhibitors / usual dose -  SGLT-2 instructions:   Blood Thinner / Instructions: Aspirin Instructions:  Comments:   Activity level: Patient is able / unable to climb a flight of stairs without difficulty; []  No CP  []  No SOB, but would have ___   Patient can / can not perform ADLs without assistance.   Anesthesia review:   Patient denies shortness of breath, fever, cough and chest pain at PAT appointment.  Patient verbalized understanding and agreement to the Pre-Surgical Instructions that were given to them at this PAT appointment. Patient was also educated of the need to review these PAT instructions again prior to his/her surgery.I reviewed the appropriate phone numbers to call if they have any and questions or concerns.

## 2022-11-29 ENCOUNTER — Encounter (HOSPITAL_COMMUNITY)
Admission: RE | Admit: 2022-11-29 | Discharge: 2022-11-29 | Disposition: A | Discharge status: Home / Self Care | Payer: Medicare Other | Source: Ambulatory Visit | Attending: Urology | Admitting: Urology

## 2022-11-29 ENCOUNTER — Encounter (HOSPITAL_COMMUNITY): Payer: Self-pay

## 2022-11-29 ENCOUNTER — Other Ambulatory Visit: Payer: Self-pay

## 2022-11-29 DIAGNOSIS — Z01818 Encounter for other preprocedural examination: Secondary | ICD-10-CM

## 2022-11-29 DIAGNOSIS — Z4589 Encounter for adjustment and management of other implanted devices: Secondary | ICD-10-CM | POA: Diagnosis not present

## 2022-11-29 DIAGNOSIS — Z1389 Encounter for screening for other disorder: Secondary | ICD-10-CM | POA: Insufficient documentation

## 2022-11-29 DIAGNOSIS — Z01812 Encounter for preprocedural laboratory examination: Secondary | ICD-10-CM | POA: Insufficient documentation

## 2022-11-29 LAB — CBC
HCT: 43.5 % (ref 39.0–52.0)
Hemoglobin: 14.2 g/dL (ref 13.0–17.0)
MCH: 28.4 pg (ref 26.0–34.0)
MCHC: 32.6 g/dL (ref 30.0–36.0)
MCV: 87 fL (ref 80.0–100.0)
Platelets: 184 10*3/uL (ref 150–400)
RBC: 5 MIL/uL (ref 4.22–5.81)
RDW: 13.3 % (ref 11.5–15.5)
WBC: 8.1 10*3/uL (ref 4.0–10.5)
nRBC: 0 % (ref 0.0–0.2)

## 2022-11-29 NOTE — Patient Instructions (Signed)
SURGICAL WAITING ROOM VISITATION  Patients having surgery or a procedure may have no more than 2 support people in the waiting area - these visitors may rotate.    Children under the age of 83 must have an adult with them who is not the patient.  Due to an increase in RSV and influenza rates and associated hospitalizations, children ages 58 and under may not visit patients in [facility] hospitals.  If the patient needs to stay at the hospital during part of their recovery, the visitor guidelines for inpatient rooms apply. Pre-op nurse will coordinate an appropriate time for 1 support person to accompany patient in pre-op.  This support person may not rotate.    Please refer to the [facility] website for the visitor guidelines for Inpatients (after your surgery is over and you are in a regular room).       Your procedure is scheduled on: 12/13/22   Report to [facility] Main Entrance    Report to admitting at 7:45 AM   Call this number if you have problems the morning of surgery [phone]   Do not eat food or drink liquids :After Midnight.    Oral Hygiene is also important to reduce your risk of infection.                                    Remember - BRUSH YOUR TEETH THE MORNING OF SURGERY WITH YOUR REGULAR TOOTHPASTE   Stop all vitamins and herbal supplements 7 days before surgery.   Take these medicines the morning of surgery with A SIP OF WATER: Celexa, Klonipin, Linzess, Pantoprazole             You may not have any metal on your body including hair pins, jewelry, and body piercing             Do not wear make-up, lotions, powders, perfumes/cologne, or deodorant              Men may shave face and neck.   Do not bring valuables to the hospital. [facility] IS NOT             RESPONSIBLE   FOR VALUABLES.   Contacts, glasses, dentures or bridgework may not be worn into surgery.   Bring small overnight bag day of surgery.   DO NOT BRING YOUR HOME MEDICATIONS  TO THE HOSPITAL. PHARMACY WILL DISPENSE MEDICATIONS LISTED ON YOUR MEDICATION LIST TO YOU DURING YOUR ADMISSION IN THE HOSPITAL!    Patients discharged on the day of surgery will not be allowed to drive home.  Someone NEEDS to stay with you for the first 24 hours after anesthesia.   Special Instructions: Bring a copy of your healthcare power of attorney and living will documents the day of surgery if you haven't scanned them before.              Please read over the following fact sheets you were given: IF YOU HAVE QUESTIONS ABOUT YOUR PRE-OP INSTRUCTIONS PLEASE CALL [phone] Tyler Coleman   If you received a COVID test during your pre-op visit  it is requested that you wear a mask when out in public, stay away from anyone that may not be feeling well and notify your surgeon if you develop symptoms. If you test positive for Covid or have been in contact with anyone that has tested positive in the last 10 days  please notify you surgeon.    [facility] - Preparing for Surgery Before surgery, you can play an important role.  Because skin is not sterile, your skin needs to be as free of germs as possible.  You can reduce the number of germs on your skin by washing with CHG (chlorahexidine gluconate) soap before surgery.  CHG is an antiseptic cleaner which kills germs and bonds with the skin to continue killing germs even after washing. Please DO NOT use if you have an allergy to CHG or antibacterial soaps.  If your skin becomes reddened/irritated stop using the CHG and inform your nurse when you arrive at Short Stay. Do not shave (including legs and underarms) for at least 48 hours prior to the first CHG shower.  You may shave your face/neck.  Please follow these instructions carefully:  1.  Shower with CHG Soap the night before surgery and the  morning of surgery.  2.  If you choose to wash your hair, wash your hair first as usual with your normal  shampoo.  3.  After you shampoo, rinse your hair and  body thoroughly to remove the shampoo.                             4.  Use CHG as you would any other liquid soap.  You can apply chg directly to the skin and wash.  Gently with a scrungie or clean washcloth.  5.  Apply the CHG Soap to your body ONLY FROM THE NECK DOWN.   Do   not use on face/ open                           Wound or open sores. Avoid contact with eyes, ears mouth and   genitals (private parts).                       Wash face,  Genitals (private parts) with your normal soap.             6.  Wash thoroughly, paying special attention to the area where your    surgery  will be performed.  7.  Thoroughly rinse your body with warm water from the neck down.  8.  DO NOT shower/wash with your normal soap after using and rinsing off the CHG Soap.                9.  Pat yourself dry with a clean towel.            10.  Wear clean pajamas.            11.  Place clean sheets on your bed the night of your first shower and do not  sleep with pets. Day of Surgery : Do not apply any lotions/deodorants the morning of surgery.  Please wear clean clothes to the hospital/surgery center.  FAILURE TO FOLLOW THESE INSTRUCTIONS MAY RESULT IN THE CANCELLATION OF YOUR SURGERY  PATIENT SIGNATURE_________________________________  NURSE SIGNATURE__________________________________  ________________________________________________________________________

## 2022-11-29 NOTE — Progress Notes (Addendum)
COVID Vaccine received:  []  No [x]  Yes Date of any COVID positive Test in last 90 days: no PCP - [facility] [place] Cardiologist - No  Chest x-ray -  EKG -  02/08/22 Epic Stress Test -no  ECHO - no Cardiac Cath - no  Bowel Prep - [x]  No  []   Yes ______  Pacemaker / ICD device [x]  No []  Yes   Spinal Cord Stimulator:[x]  No []  Yes       History of Sleep Apnea? [x]  No []  Yes   CPAP used?- [x]  No []  Yes    Does the patient monitor blood sugar?          [x]  No []  Yes  []  N/A  Patient has: []  NO Hx DM   []  Pre-DM                 []  DM1  []   DM2 Does patient have a [employer] or Dexacom? []  No []  Yes   Fasting Blood Sugar Ranges-  Checks Blood Sugar _____ times a day  GLP1 agonist / usual dose - no GLP1 instructions:  SGLT-2 inhibitors / usual dose - no SGLT-2 instructions:   Blood Thinner / Instructions:no Aspirin Instructions:no  Comments:   Activity level: Patient is able  climb a flight of stairs without difficulty; [x]  No CP  [x]  No SOB,   Patient can perform ADLs without assistance.   Anesthesia review:   Patient denies shortness of breath, fever, cough and chest pain at PAT appointment.  Patient verbalized understanding and agreement to the Pre-Surgical Instructions that were given to them at this PAT appointment. Patient was also educated of the need to review these PAT instructions again prior to his/her surgery.I reviewed the appropriate phone numbers to call if they have any and questions or concerns.

## 2022-11-30 LAB — URINE CULTURE: Culture: NO GROWTH

## 2022-12-12 MED ORDER — GENTAMICIN SULFATE 40 MG/ML IJ SOLN
5.0000 mg/kg | INTRAVENOUS | Status: AC
  Administered 2022-12-13: 430 mg via INTRAVENOUS
  Filled 2022-12-12: qty 10.75

## 2022-12-12 NOTE — Anesthesia Preprocedure Evaluation (Signed)
Anesthesia Evaluation  Patient identified by MRN, date of birth, ID band Patient awake    Reviewed: Allergy & Precautions, NPO status , Patient's Chart, lab work & pertinent test results  History of Anesthesia Complications (+) history of anesthetic complications  Airway Mallampati: II  TM Distance: >3 FB Neck ROM: Full    Dental no notable dental hx. (+) Dental Advisory Given, Teeth Intact   Pulmonary sleep apnea , former smoker   Pulmonary exam normal breath sounds clear to auscultation       Cardiovascular hypertension, Pt. on medications Normal cardiovascular exam Rhythm:Regular Rate:Normal     Neuro/Psych  PSYCHIATRIC DISORDERS Anxiety Depression    negative neurological ROS     GI/Hepatic ,GERD  Medicated and Controlled,,(+)     substance abuse    Endo/Other  negative endocrine ROS    Renal/GU negative Renal ROS     Musculoskeletal  (+) Arthritis ,    Abdominal  (+) + obese  Peds  Hematology negative hematology ROS (+)   Anesthesia Other Findings ERECTILE DYSFUNCTION  Reproductive/Obstetrics                             Anesthesia Physical Anesthesia Plan  ASA: 3  Anesthesia Plan: General   Post-op Pain Management: Tylenol PO (pre-op)*   Induction: Intravenous  PONV Risk Score and Plan: 3 and Ondansetron, Dexamethasone, Midazolam and Treatment may vary due to age or medical condition  Airway Management Planned: Oral ETT  Additional Equipment:   Intra-op Plan:   Post-operative Plan: Extubation in OR  Informed Consent: I have reviewed the patients History and Physical, chart, labs and discussed the procedure including the risks, benefits and alternatives for the proposed anesthesia with the patient or authorized representative who has indicated his/her understanding and acceptance.     Dental advisory given  Plan Discussed with: CRNA  Anesthesia Plan Comments:          Anesthesia Quick Evaluation

## 2022-12-12 NOTE — Progress Notes (Signed)
Spoke with pts wife in regards to surgical time change. Pt to arrive at [facility] admitting at 515 for scheduled surgery on Tuesday 12/13/2022. No food after midnight; clear liquids from midnight till 0430 then nothing by mouth.

## 2022-12-13 ENCOUNTER — Ambulatory Visit (HOSPITAL_COMMUNITY): Payer: Medicare Other | Admitting: Certified Registered"

## 2022-12-13 ENCOUNTER — Other Ambulatory Visit: Payer: Self-pay

## 2022-12-13 ENCOUNTER — Encounter (HOSPITAL_COMMUNITY)
Admission: RE | Disposition: A | Discharge status: Home / Self Care | Payer: Self-pay | Source: Ambulatory Visit | Attending: Urology

## 2022-12-13 ENCOUNTER — Encounter (HOSPITAL_COMMUNITY): Payer: Self-pay | Admitting: Urology

## 2022-12-13 ENCOUNTER — Ambulatory Visit (HOSPITAL_BASED_OUTPATIENT_CLINIC_OR_DEPARTMENT_OTHER): Payer: Medicare Other | Admitting: Certified Registered"

## 2022-12-13 ENCOUNTER — Ambulatory Visit (HOSPITAL_COMMUNITY)
Admission: RE | Admit: 2022-12-13 | Discharge: 2022-12-13 | Disposition: A | Discharge status: Home / Self Care | Payer: Medicare Other | Source: Ambulatory Visit | Attending: Urology | Admitting: Urology

## 2022-12-13 DIAGNOSIS — Y838 Other surgical procedures as the cause of abnormal reaction of the patient, or of later complication, without mention of misadventure at the time of the procedure: Secondary | ICD-10-CM | POA: Diagnosis not present

## 2022-12-13 DIAGNOSIS — I10 Essential (primary) hypertension: Secondary | ICD-10-CM | POA: Diagnosis not present

## 2022-12-13 DIAGNOSIS — T83490A Other mechanical complication of penile (implanted) prosthesis, initial encounter: Secondary | ICD-10-CM

## 2022-12-13 DIAGNOSIS — N529 Male erectile dysfunction, unspecified: Secondary | ICD-10-CM | POA: Diagnosis not present

## 2022-12-13 DIAGNOSIS — Z87891 Personal history of nicotine dependence: Secondary | ICD-10-CM | POA: Diagnosis not present

## 2022-12-13 DIAGNOSIS — G4730 Sleep apnea, unspecified: Secondary | ICD-10-CM

## 2022-12-13 DIAGNOSIS — K219 Gastro-esophageal reflux disease without esophagitis: Secondary | ICD-10-CM | POA: Diagnosis not present

## 2022-12-13 DIAGNOSIS — Z01818 Encounter for other preprocedural examination: Secondary | ICD-10-CM

## 2022-12-13 DIAGNOSIS — Z87440 Personal history of urinary (tract) infections: Secondary | ICD-10-CM | POA: Diagnosis not present

## 2022-12-13 DIAGNOSIS — M1990 Unspecified osteoarthritis, unspecified site: Secondary | ICD-10-CM | POA: Insufficient documentation

## 2022-12-13 DIAGNOSIS — F1120 Opioid dependence, uncomplicated: Secondary | ICD-10-CM | POA: Diagnosis not present

## 2022-12-13 HISTORY — PX: PENILE PROSTHESIS IMPLANT: SHX240

## 2022-12-13 SURGERY — INSERTION, PENILE PROSTHESIS, INFLATABLE
Anesthesia: General

## 2022-12-13 MED ORDER — ACETAMINOPHEN 500 MG PO TABS
1000.0000 mg | ORAL_TABLET | Freq: Once | ORAL | Status: AC
  Administered 2022-12-13: 1000 mg via ORAL
  Filled 2022-12-13: qty 2

## 2022-12-13 MED ORDER — PROPOFOL 10 MG/ML IV BOLUS
INTRAVENOUS | Status: DC | PRN
  Administered 2022-12-13: 150 mg via INTRAVENOUS

## 2022-12-13 MED ORDER — CHLORHEXIDINE GLUCONATE 0.12 % MT SOLN
15.0000 mL | Freq: Once | OROMUCOSAL | Status: AC
  Administered 2022-12-13: 15 mL via OROMUCOSAL

## 2022-12-13 MED ORDER — VANCOMYCIN HCL 1000 MG IV SOLR
Freq: Once | Status: DC
  Filled 2022-12-13: qty 20

## 2022-12-13 MED ORDER — ROCURONIUM BROMIDE 10 MG/ML (PF) SYRINGE
PREFILLED_SYRINGE | INTRAVENOUS | Status: AC
  Filled 2022-12-13: qty 10

## 2022-12-13 MED ORDER — FLUCONAZOLE IN SODIUM CHLORIDE 200-0.9 MG/100ML-% IV SOLN
200.0000 mg | INTRAVENOUS | Status: DC
  Administered 2022-12-13: 200 mg via INTRAVENOUS
  Filled 2022-12-13: qty 100

## 2022-12-13 MED ORDER — HYDROMORPHONE HCL 1 MG/ML IJ SOLN
0.2500 mg | INTRAMUSCULAR | Status: DC | PRN
  Administered 2022-12-13 (×3): 0.5 mg via INTRAVENOUS

## 2022-12-13 MED ORDER — ONDANSETRON HCL 4 MG/2ML IJ SOLN
INTRAMUSCULAR | Status: DC | PRN
  Administered 2022-12-13: 4 mg via INTRAVENOUS

## 2022-12-13 MED ORDER — DROPERIDOL 2.5 MG/ML IJ SOLN: 0.6250 mg | Freq: Once | INTRAMUSCULAR | Status: DC | PRN

## 2022-12-13 MED ORDER — DEXAMETHASONE SODIUM PHOSPHATE 10 MG/ML IJ SOLN
INTRAMUSCULAR | Status: AC
  Filled 2022-12-13: qty 1

## 2022-12-13 MED ORDER — KETAMINE HCL 50 MG/5ML IJ SOSY
PREFILLED_SYRINGE | INTRAMUSCULAR | Status: AC
  Filled 2022-12-13: qty 5

## 2022-12-13 MED ORDER — FENTANYL CITRATE (PF) 100 MCG/2ML IJ SOLN
INTRAMUSCULAR | Status: DC | PRN
  Administered 2022-12-13: 100 ug via INTRAVENOUS

## 2022-12-13 MED ORDER — CHLORHEXIDINE GLUCONATE 4 % EX SOLN: Freq: Once | CUTANEOUS | Status: DC

## 2022-12-13 MED ORDER — PROPOFOL 10 MG/ML IV BOLUS
INTRAVENOUS | Status: AC
  Filled 2022-12-13: qty 20

## 2022-12-13 MED ORDER — LIDOCAINE HCL 1 % IJ SOLN
INTRAMUSCULAR | Status: AC
  Filled 2022-12-13: qty 20

## 2022-12-13 MED ORDER — SUGAMMADEX SODIUM 200 MG/2ML IV SOLN
INTRAVENOUS | Status: DC | PRN
  Administered 2022-12-13: 200 mg via INTRAVENOUS

## 2022-12-13 MED ORDER — VANCOMYCIN HCL 1500 MG/300ML IV SOLN
1500.0000 mg | INTRAVENOUS | Status: AC
  Administered 2022-12-13: 1500 mg via INTRAVENOUS
  Filled 2022-12-13: qty 300

## 2022-12-13 MED ORDER — ORAL CARE MOUTH RINSE: 15.0000 mL | Freq: Once | OROMUCOSAL | Status: AC

## 2022-12-13 MED ORDER — STERILE WATER FOR IRRIGATION IR SOLN
Status: DC | PRN
  Administered 2022-12-13: 1000 mL

## 2022-12-13 MED ORDER — MIDAZOLAM HCL 2 MG/2ML IJ SOLN
INTRAMUSCULAR | Status: AC
  Filled 2022-12-13: qty 2

## 2022-12-13 MED ORDER — SULFAMETHOXAZOLE-TRIMETHOPRIM 800-160 MG PO TABS: 1.0000 | ORAL_TABLET | Freq: Two times a day (BID) | ORAL | 0 refills | Status: DC

## 2022-12-13 MED ORDER — IRRISEPT - 450ML BOTTLE WITH 0.05% CHG IN STERILE WATER, USP 99.95% OPTIME
TOPICAL | Status: DC | PRN
  Administered 2022-12-13: 900 mL

## 2022-12-13 MED ORDER — MIDAZOLAM HCL 2 MG/2ML IJ SOLN
INTRAMUSCULAR | Status: DC | PRN
  Administered 2022-12-13: 2 mg via INTRAVENOUS

## 2022-12-13 MED ORDER — DEXMEDETOMIDINE HCL IN NACL 80 MCG/20ML IV SOLN
INTRAVENOUS | Status: DC | PRN
Start: 2022-12-13 — End: 2022-12-13
  Administered 2022-12-13 (×2): 4 ug via INTRAVENOUS

## 2022-12-13 MED ORDER — BUPIVACAINE HCL 0.5 % IJ SOLN
INTRAMUSCULAR | Status: DC | PRN
  Administered 2022-12-13: 5 mL

## 2022-12-13 MED ORDER — ROCURONIUM BROMIDE 10 MG/ML (PF) SYRINGE
PREFILLED_SYRINGE | INTRAVENOUS | Status: DC | PRN
  Administered 2022-12-13: 20 mg via INTRAVENOUS
  Administered 2022-12-13: 60 mg via INTRAVENOUS
  Administered 2022-12-13: 20 mg via INTRAVENOUS

## 2022-12-13 MED ORDER — DEXAMETHASONE SODIUM PHOSPHATE 10 MG/ML IJ SOLN
INTRAMUSCULAR | Status: DC | PRN
  Administered 2022-12-13: 4 mg via INTRAVENOUS

## 2022-12-13 MED ORDER — MEPERIDINE HCL 50 MG/ML IJ SOLN: 6.2500 mg | INTRAMUSCULAR | Status: DC | PRN

## 2022-12-13 MED ORDER — VANCOMYCIN HCL 1 G IV SOLR
INTRAVENOUS | Status: DC | PRN
  Administered 2022-12-13: 3000 mL

## 2022-12-13 MED ORDER — CELECOXIB 200 MG PO CAPS: 200.0000 mg | ORAL_CAPSULE | Freq: Two times a day (BID) | ORAL | 1 refills | Status: AC

## 2022-12-13 MED ORDER — LIDOCAINE HCL 1 % IJ SOLN
INTRAMUSCULAR | Status: DC | PRN
  Administered 2022-12-13: 5 mL

## 2022-12-13 MED ORDER — HYDROMORPHONE HCL 1 MG/ML IJ SOLN
INTRAMUSCULAR | Status: AC
  Administered 2022-12-13: 0.5 mg via INTRAVENOUS
  Filled 2022-12-13: qty 2

## 2022-12-13 MED ORDER — FENTANYL CITRATE (PF) 100 MCG/2ML IJ SOLN
INTRAMUSCULAR | Status: AC
  Filled 2022-12-13: qty 2

## 2022-12-13 MED ORDER — BUPIVACAINE HCL (PF) 0.5 % IJ SOLN
INTRAMUSCULAR | Status: AC
  Filled 2022-12-13: qty 30

## 2022-12-13 MED ORDER — DEXMEDETOMIDINE HCL IN NACL 80 MCG/20ML IV SOLN
INTRAVENOUS | Status: AC
  Filled 2022-12-13: qty 20

## 2022-12-13 MED ORDER — KETAMINE HCL 10 MG/ML IJ SOLN
INTRAMUSCULAR | Status: DC | PRN
Start: 2022-12-13 — End: 2022-12-13
  Administered 2022-12-13: 25 mg via INTRAVENOUS

## 2022-12-13 MED ORDER — LIDOCAINE 2% (20 MG/ML) 5 ML SYRINGE
INTRAMUSCULAR | Status: DC | PRN
  Administered 2022-12-13: 40 mg via INTRAVENOUS

## 2022-12-13 MED ORDER — LACTATED RINGERS IV SOLN: INTRAVENOUS | Status: DC

## 2022-12-13 MED ORDER — ONDANSETRON HCL 4 MG/2ML IJ SOLN
INTRAMUSCULAR | Status: AC
  Filled 2022-12-13: qty 2

## 2022-12-13 MED ORDER — OXYCODONE HCL 5 MG/5ML PO SOLN
5.0000 mg | ORAL | 0 refills | Status: DC | PRN
Start: 2022-12-13 — End: 2023-06-01

## 2022-12-13 MED ORDER — ACETAMINOPHEN 500 MG PO TABS: 1000.0000 mg | ORAL_TABLET | Freq: Four times a day (QID) | ORAL | 0 refills | Status: DC

## 2022-12-13 SURGICAL SUPPLY — 53 items
ADH SKN CLS APL DERMABOND .7 (GAUZE/BANDAGES/DRESSINGS) ×1
APL PRP STRL LF DISP 70% ISPRP (MISCELLANEOUS) ×2
BAG DRN RND TRDRP ANRFLXCHMBR (UROLOGICAL SUPPLIES) ×1
BAG URINE DRAIN 2000ML AR STRL (UROLOGICAL SUPPLIES) ×1 IMPLANT
BLADE SURG 15 STRL LF DISP TIS (BLADE) IMPLANT
BLADE SURG 15 STRL SS (BLADE)
BNDG GAUZE DERMACEA FLUFF 4 (GAUZE/BANDAGES/DRESSINGS) ×1 IMPLANT
BNDG GZE DERMACEA 4 6PLY (GAUZE/BANDAGES/DRESSINGS) ×1
BRIEF MESH DISP LRG (UNDERPADS AND DIAPERS) ×1 IMPLANT
CATH COUDE 5CC RIBBED (CATHETERS) ×1 IMPLANT
CATH RIBBED COUDE 5CC (CATHETERS) ×1
CHLORAPREP W/TINT 26 (MISCELLANEOUS) ×2 IMPLANT
COVER MAYO STAND STRL (DRAPES) ×1 IMPLANT
COVER SURGICAL LIGHT HANDLE (MISCELLANEOUS) ×1 IMPLANT
DERMABOND ADVANCED .7 DNX12 (GAUZE/BANDAGES/DRESSINGS) ×1 IMPLANT
DRAIN CHANNEL 10F 3/8 F FF (DRAIN) IMPLANT
DRAPE INCISE IOBAN 66X45 STRL (DRAPES) ×1 IMPLANT
DRAPE LAPAROTOMY T 98X78 PEDS (DRAPES) ×1 IMPLANT
DRSG TEGADERM 4X4.75 (GAUZE/BANDAGES/DRESSINGS) ×1 IMPLANT
ELECT REM PT RETURN 15FT ADLT (MISCELLANEOUS) ×1 IMPLANT
EVACUATOR SILICONE 100CC (DRAIN) ×1 IMPLANT
GLOVE BIO SURGEON STRL SZ7 (GLOVE) ×1 IMPLANT
GLOVE BIOGEL PI IND STRL 7.0 (GLOVE) ×1 IMPLANT
GOWN STRL REUS W/ TWL LRG LVL3 (GOWN DISPOSABLE) ×1 IMPLANT
GOWN STRL REUS W/TWL LRG LVL3 (GOWN DISPOSABLE) ×1
HOLDER FOLEY CATH W/STRAP (MISCELLANEOUS) ×1 IMPLANT
JET LAVAGE IRRISEPT WOUND (IRRIGATION / IRRIGATOR) ×2
KIT BASIN OR (CUSTOM PROCEDURE TRAY) ×1 IMPLANT
KIT TURNOVER KIT A (KITS) IMPLANT
LAVAGE JET IRRISEPT WOUND (IRRIGATION / IRRIGATOR) IMPLANT
NDL HYPO 22X1.5 SAFETY MO (MISCELLANEOUS) ×1 IMPLANT
NEEDLE HYPO 22X1.5 SAFETY MO (MISCELLANEOUS) ×1
NS IRRIG 1000ML POUR BTL (IV SOLUTION) ×1 IMPLANT
PACK GENERAL/GYN (CUSTOM PROCEDURE TRAY) ×1 IMPLANT
PLUG CATH AND CAP STRL 200 (CATHETERS) ×1 IMPLANT
PROSTH PENILE TACTRA 11X16X25 (Miscellaneous) ×1 IMPLANT
PROSTHESIS PENILE TACTRA 11X16 (Miscellaneous) ×1 IMPLANT
PROSTHESIS PNL TACTRA 11X16X25 (Miscellaneous) IMPLANT
RETRACTOR DEEP SCROTAL PENILE (MISCELLANEOUS) IMPLANT
RETRACTOR WILSON SYSTEM (INSTRUMENTS) IMPLANT
SURGILUBE 2OZ TUBE FLIPTOP (MISCELLANEOUS) IMPLANT
SUT ETHILON 3 0 PS 1 (SUTURE) IMPLANT
SUT MNCRL AB 4-0 PS2 18 (SUTURE) ×1 IMPLANT
SUT VIC AB 2-0 UR6 27 (SUTURE) ×4 IMPLANT
SUT VIC AB 3-0 SH 27 (SUTURE) ×2
SUT VIC AB 3-0 SH 27X BRD (SUTURE) ×2 IMPLANT
SYR 10ML LL (SYRINGE) ×1 IMPLANT
SYR 50ML LL SCALE MARK (SYRINGE) ×3 IMPLANT
SYR BULB IRRIG 60ML STRL (SYRINGE) IMPLANT
SYR CONTROL 10ML LL (SYRINGE) ×1 IMPLANT
TOWEL GREEN STERILE FF (TOWEL DISPOSABLE) ×1 IMPLANT
TOWEL OR 17X26 10 PK STRL BLUE (TOWEL DISPOSABLE) ×2 IMPLANT
WATER STERILE IRR 500ML POUR (IV SOLUTION) ×1 IMPLANT

## 2022-12-13 NOTE — Transfer of Care (Signed)
Immediate Anesthesia Transfer of Care Note  Patient: Tyler Coleman  Procedure(s) Performed: REMOVAL AND REPLACEMENT OF MALLEABLE PENILE IMPLANT  Patient Location: PACU  Anesthesia Type:General  Level of Consciousness: awake, alert , and patient cooperative  Airway & Oxygen Therapy: Patient Spontanous Breathing and Patient connected to face mask oxygen  Post-op Assessment: Report given to RN and Post -op Vital signs reviewed and stable  Post vital signs: Reviewed and stable  Last Vitals:  Vitals Value Taken Time  BP 138/70 12/13/22 1008  Temp    Pulse 81 12/13/22 1009  Resp 13 12/13/22 1009  SpO2 100 % 12/13/22 1009  Vitals shown include unfiled device data.  Last Pain:  Vitals:   12/13/22 0610  TempSrc: Oral  PainSc:       Patients Stated Pain Goal: 5 (12/13/22 0553)  Complications: No notable events documented.

## 2022-12-13 NOTE — Anesthesia Procedure Notes (Signed)
Procedure Name: Intubation Date/Time: 12/13/2022 7:44 AM  Performed by: Sindy Guadeloupe, CRNAPre-anesthesia Checklist: Patient identified, Emergency Drugs available, Suction available, Patient being monitored and Timeout performed Patient Re-evaluated:Patient Re-evaluated prior to induction Oxygen Delivery Method: Circle system utilized Preoxygenation: Pre-oxygenation with 100% oxygen Induction Type: IV induction Ventilation: Mask ventilation without difficulty Laryngoscope Size: Mac and 4 Grade View: Grade I Tube type: Oral Tube size: 7.5 mm Number of attempts: 1 Airway Equipment and Method: Stylet Placement Confirmation: ETT inserted through vocal cords under direct vision, positive ETCO2 and breath sounds checked- equal and bilateral Secured at: 23 cm Tube secured with: Tape Dental Injury: Teeth and Oropharynx as per pre-operative assessment

## 2022-12-13 NOTE — Discharge Instructions (Signed)

## 2022-12-13 NOTE — Op Note (Signed)
PATIENT:  Tyler Coleman  PRE-OPERATIVE DIAGNOSIS:   Mechanical malfunction of malleable prosthesis Recurrent UTI  POST-OPERATIVE DIAGNOSIS:  Same  PROCEDURE:   Removal and replacement of malleable penile prosthesis  SURGEON:  Irine Seal MD  ASST: none  INDICATION: He has had long-standing organic erectile dysfunction and refractory to other modes of treatment. He has elected to proceed with prosthesis implantation.  ANESTHESIA:  General  EBL:  Minimal  Device: AMS tactra - cut to 22 cm bilaterally, 0 cm rear tip  LOCAL MEDICATIONS USED:  None  SPECIMEN: None  DISPOSITION OF SPECIMEN:  N/A  FINDINGS: There appeared to be an over and back crossover of the right rod. This was addressed during the procedure. There was no evidence of urethral injury/perforation. After placing a new implant, there was no apparent crossover and the penis was straighter.  Description of procedure: The patient was taken to the major operating room, placed on the table and administered general anesthesia in the supine position. His genitalia was then prepped with chlorhexidine x 2. He was draped in the usual sterile fashion, and I used [place] on the field. An official timeout was then performed.  A dorsal penile block was performed.   A 14 French coude catheter was then placed in the bladder and the bladder was drained and the catheter was plugged. A midline penoscrotal incision was then made and the dissection was carried down to the corpora and urethra. The lonestar retractor was positioned so as to have excellent exposure. Several 2-0 Vicryl sutures were then placed proximally in each corpus cavernosum to serve as stay sutures. An incision was then made in the corpus cavernosum first on the left-hand side with the knife. I was able to expose each malleable implant. On the right there was an apparent over and back crossover in the proximal shaft.    I then dilated the corpus cavernosum with the a  12 Fr brooks dilator distally and proximally. Field goal post tests were performed and there was no evidence of perforations or crossover. I then irrigated the corpus cavernosum with antibiotic solution, and there was no evidence of urethral perforation or injury. I measured the distance proximally and distally from the stay sutures and was found to be 12 and 11 cm, respectively on the right.I then turned my attention to the contralateral corpus cavernosum and placed my stay sutures, made my corporotomy and dilated the corpus cavernosum in an identical fashion. This was measured and also was found to be 12 cm proximally and 10.5 distally. It was irrigated with anastomotic solution as was the scrotum. I then cut each malleable rod to 22 cm and used a 0 cm RTE.   I placed the left malleable rod first. Before placing the right, I used 2-0 vicryl to imbricate the corpora/capsule where the crossover had occurred. The right rod was then placed. I carefully confirmed that there was no crossover, and the penis was noticeably straighter. The corporotomies were then close.d     I irrigated the wound one last time with antibiotic irrigation and then closed the deep scrotal tissue over the tubing and pump with running 3-0 vicryl suture. I placed a 10 Fr blake drain over the corporotomies. A second layer was then closed over this first layer with running 3- 0 vicryl and running skin suture w/ 4-0 monocryl performed. Incision dressed with dermabond.  A mummy wrap was applied. The foley was removed. The drain connected to suction bulb and the patient was  awakened and taken recovery room in stable and satisfactory condition. He tolerated the procedure well and there were no intraoperative complications. Needle sponge and instrument counts were correct at the end of the operation.

## 2022-12-13 NOTE — Anesthesia Postprocedure Evaluation (Signed)
Anesthesia Post Note  Patient: Tyler Coleman  Procedure(s) Performed: REMOVAL AND REPLACEMENT OF MALLEABLE PENILE IMPLANT     Patient location during evaluation: PACU Anesthesia Type: General Level of consciousness: sedated and patient cooperative Pain management: pain level controlled Vital Signs Assessment: post-procedure vital signs reviewed and stable Respiratory status: spontaneous breathing Cardiovascular status: stable Anesthetic complications: no   No notable events documented.  Last Vitals:  Vitals:   12/13/22 1030 12/13/22 1045  BP: 130/79 (!) 150/81  Pulse: 73 66  Resp: 15 15  Temp:    SpO2: 95% 95%    Last Pain:  Vitals:   12/13/22 1045  TempSrc:   PainSc: 3                  Lewie Loron

## 2022-12-13 NOTE — H&P (Signed)
H&P  History of Present Illness: Tyler Coleman is a 67 y.o. year old M who presents today for removal/replacement of malfunctioning malleable prosthesis.   No acute complaints  Past Medical History:  Diagnosis Date   Alcohol use disorder, moderate, dependence (HCC)    per pcp note in epic    (12-09-2021  pt stated last drank alcohol 6 months ago)   Anxiety    Arthritis    Complication of anesthesia    12-09-2021  pt stated with ankle surgery in 1978 "my lung froze and could not breath"  told it was a medication that is no longer used)   DDD (degenerative disc disease), lumbar    Delayed gastric emptying    Depression    Drug induced constipation    chronic   ED (erectile dysfunction)    Esophageal achalasia    DG esophagus imaging 09-24-2021--  seen by surgeon dr Sheliah Hatch, pt is scheduled for esophageal manometry 03-23-2022   GERD (gastroesophageal reflux disease)    Hypertension    Hypogonadism in male    Mixed hyperlipidemia    Opioid dependence on agonist therapy (HCC)    followed by pcp---   twice daily suboxone   Polysubstance abuse (HCC)    cocaince, heroin, fentanyl, benzodiazepines, opiate addiction  (12-09-2021  pt stated stopped at age 41 but every 10 yrs ago he binge's,  stated last drug use was heroin IV narcon used by EMS (ED visit in epic)   Sleep apnea     Past Surgical History:  Procedure Laterality Date   ANKLE SURGERY Right 1978   per pt for fracture, has retained hardware   BACK SURGERY     COLONOSCOPY WITH PROPOFOL  06/22/2021   by dr Lavon Paganini   ESOPHAGEAL MANOMETRY N/A 01/05/2022   Procedure: ESOPHAGEAL MANOMETRY (EM);  Surgeon: Napoleon Form, MD;  Location: [facility];  Service: Gastroenterology;  Laterality: N/A;   ESOPHAGOGASTRODUODENOSCOPY (EGD) WITH ESOPHAGEAL DILATION  2020   LUMBAR SPINE SURGERY     1980  and 1985;  including L4-5 lamenctomy   NASAL SINUS SURGERY     1990s   PENILE PROSTHESIS IMPLANT N/A 12/22/2021    Procedure: INSERTION OF MALLEABLE PENILE PROSTHESIS;  Surgeon: Despina Arias, MD;  Location: [facility];  Service: Urology;  Laterality: N/A;   SHOULDER SURGERY Bilateral    1995 and 2000    Home Medications:  Current Meds  Medication Sig   benazepril (LOTENSIN) 10 MG tablet Take 10 mg by mouth daily.   Buprenorphine HCl-Naloxone HCl (SUBOXONE) 12-3 MG FILM Place 1 Film under the tongue daily as needed (feeling withdrawal symptoms).   Ca Carbonate-Mag Hydroxide (ROLAIDS PO) Take 1 tablet by mouth daily as needed (heartburn).   Cephalexin 500 MG tablet Take 500 mg by mouth daily.   citalopram (CELEXA) 20 MG tablet Take 30 mg by mouth daily.   fluocinonide ointment (LIDEX) 0.05 % Apply 1 Application topically 2 (two) times daily.   fluticasone (FLONASE) 50 MCG/ACT nasal spray Place 1 spray into both nostrils daily as needed for allergies.   linaclotide (LINZESS) 290 MCG CAPS capsule Take 290 mcg by mouth daily as needed (constipation).   Melatonin 10 MG TABS Take 10 mg by mouth at bedtime as needed (sleep).   mupirocin ointment (BACTROBAN) 2 % Apply 1 Application topically as needed (itching on legs).   pantoprazole (PROTONIX) 40 MG tablet Take 80 mg by mouth daily.   Polyethylene Glycol 3350 (MIRALAX PO) Take  17 g by mouth every other day. Alternates with metamucil   psyllium (METAMUCIL) 58.6 % powder Take 1 packet by mouth every other day. Per pt alternates w/ miralax , takes at night   sodium chloride (OCEAN) 0.65 % SOLN nasal spray Place 1 spray into both nostrils as needed for congestion.   tamsulosin (FLOMAX) 0.4 MG CAPS capsule Take 0.4 mg by mouth at bedtime.   testosterone cypionate (DEPO-TESTOSTERONE) 200 MG/ML injection Inject 60 mg into the muscle every Wednesday.    Allergies:  Allergies  Allergen Reactions   Augmentin [Amoxicillin-Pot Clavulanate] Other (See Comments)    Per pt severe burning of throat , "it got stuck" does not want it again   Doxycycline      Unknown reaction per pcp note   Erythromycin     Unknown reaction per pcp note   Thorazine [Chlorpromazine] Swelling    "My throat swelled"    Family History  Problem Relation Age of Onset   Heart failure Mother    Heart failure Father    Mental illness Neg Hx     Social History:  reports that he quit smoking about 40 years ago. His smoking use included cigarettes. He started smoking about 50 years ago. He has never been exposed to tobacco smoke. He has quit using smokeless tobacco. He reports that he does not currently use alcohol. He reports that he does not currently use drugs after having used the following drugs: Cocaine, Heroin, Other-see comments, Fentanyl, and Benzodiazepines.  ROS: A complete review of systems was performed.  All systems are negative except for pertinent findings as noted.  Physical Exam:  Vital signs in last 24 hours: Temp:  [98 F (36.7 C)] 98 F (36.7 C) (09/24 0610) Pulse Rate:  [58] 58 (09/24 0610) Resp:  [16] 16 (09/24 0610) BP: (125)/(80) 125/80 (09/24 0610) SpO2:  [95 %] 95 % (09/24 0610) Weight:  [97.5 kg] 97.5 kg (09/24 0553) Constitutional:  Alert and oriented, No acute distress Cardiovascular: Regular rate and rhythm Respiratory: Normal respiratory effort, Lungs clear bilaterally GI: Abdomen is soft, nontender, nondistended, no abdominal masses Lymphatic: No lymphadenopathy Neurologic: Grossly intact, no focal deficits Psychiatric: Normal mood and affect   Laboratory Data:  No results for input(s): "WBC", "HGB", "HCT", "PLT" in the last 72 hours.  No results for input(s): "NA", "K", "CL", "GLUCOSE", "BUN", "CALCIUM", "CREATININE" in the last 72 hours.  Invalid input(s): "CO3"   No results found for this or any previous visit (from the past 24 hour(s)). No results found for this or any previous visit (from the past 240 hour(s)).  Renal Function: No results for input(s): "CREATININE" in the last 168 hours. CrCl cannot be  calculated (Patient's most recent lab result is older than the maximum 21 days allowed.).  Radiologic Imaging: No results found.  Assessment:  Tyler Coleman is a 67 y.o. year old M with ED, mechanical complication of malleable penile implant  Plan:  To OR as planned for removal, washout, and replacement of malleable prosthesis. Procedure and risks reviewed, including but not limited to bleeding, infection, implant infection, implant malfunction, implant malplacement, erosion, damage to adjacent structures, pain, urinary retention. Discussed if I identify a urethral injury I would not be able to replace a malleable rod and catheter would be required to let heal. Also discussed possibility of UTIs persisting. All questions answered   Irine Seal, MD 12/13/2022, 7:25 AM  [facility] Pager: [phone]

## 2022-12-14 ENCOUNTER — Encounter (HOSPITAL_COMMUNITY): Payer: Self-pay | Admitting: Urology

## 2023-01-25 ENCOUNTER — Other Ambulatory Visit (INDEPENDENT_AMBULATORY_CARE_PROVIDER_SITE_OTHER): Payer: Medicare Other

## 2023-01-25 ENCOUNTER — Ambulatory Visit: Payer: Medicare Other | Admitting: Orthopaedic Surgery

## 2023-01-25 VITALS — Ht 70.75 in | Wt 233.0 lb

## 2023-01-25 DIAGNOSIS — M25552 Pain in left hip: Secondary | ICD-10-CM

## 2023-01-25 DIAGNOSIS — M1612 Unilateral primary osteoarthritis, left hip: Secondary | ICD-10-CM | POA: Diagnosis not present

## 2023-01-25 NOTE — Progress Notes (Signed)
The patient is a 67 year old gentleman with debilitating left hip pain is gotten worse for about 3 to 4 months now with no known injury.  He is a dull aching pain in that hip.  It hurts with pivoting activities.  It is definitely affecting his posture.  It is waking him up at night as well.  He denies any right hip pain.  He does have chronic low back issues.  He has also had urologic surgery with the penile implant that was revised a believe about 6 weeks ago.  He is not a diabetic and not on blood thinning medications.  I was able to review all of his past medical history and medications within epic and they are well-documented and listed.  He currently reports 10 out of 10 left hip pain and groin pain.  It is detrimentally affecting his mobility, his quality of life and his actives daily living.  He currently denies any headache, chest pain, shortness of breath, fever, chills, nausea, vomiting.  On exam his right hip moves smoothly and fluidly.  The left hip has significant stiffness with internal and external rotation and is causing significant pain in the groin with rotation.  Standing AP pelvis and lateral left hip shows severe arthritis of the left hip with superior lateral joint space narrowing as well as osteophytes around the left hip and sclerotic changes.  The right hip joint space is well-maintained but there is some signs of femoral acetabular impingement on the right hip.  The lower aspect of his lumbar spine does show significant degenerative changes at several levels.  We had a long and thorough discussion about hip replacement surgery.  Given the debilitating pain that he is having he does wish to proceed with surgery and I agree with this as well based on his x-ray findings and clinical exam findings combined with his pain.  He is hoping to have this as soon as we can get him on the schedule.  All questions and concerns were answered and addressed.  He understands we will be in touch.  We  did discuss the risks and benefits of the surgery and in detail.  I talked about the intraoperative and postoperative course and what to expect.  We described what the surgery involves and gave him a handout about hip replacement surgery.  He understands our surgery scheduler will reach out to him.

## 2023-02-02 ENCOUNTER — Telehealth: Payer: Self-pay

## 2023-02-02 NOTE — Telephone Encounter (Signed)
I called patient to discuss scheduling left THA.  Left voice mail for return call on home number.  Cell phone mailbox was full.

## 2023-02-07 ENCOUNTER — Telehealth: Payer: Self-pay

## 2023-02-07 NOTE — Telephone Encounter (Signed)
Voice mail received from patient.  He has decided to wait a while for THA.  He stated he would call me back to schedule when ready.

## 2023-02-27 ENCOUNTER — Ambulatory Visit: Payer: Medicare Other | Admitting: Orthopaedic Surgery

## 2023-02-27 ENCOUNTER — Other Ambulatory Visit: Payer: Self-pay

## 2023-02-27 ENCOUNTER — Ambulatory Visit (INDEPENDENT_AMBULATORY_CARE_PROVIDER_SITE_OTHER): Payer: Medicare Other | Admitting: Sports Medicine

## 2023-02-27 ENCOUNTER — Encounter: Payer: Self-pay | Admitting: Sports Medicine

## 2023-02-27 DIAGNOSIS — M25552 Pain in left hip: Secondary | ICD-10-CM | POA: Diagnosis not present

## 2023-02-27 DIAGNOSIS — M1612 Unilateral primary osteoarthritis, left hip: Secondary | ICD-10-CM

## 2023-02-27 MED ORDER — METHYLPREDNISOLONE ACETATE 40 MG/ML IJ SUSP
80.0000 mg | INTRAMUSCULAR | Status: AC | PRN
Start: 2023-02-27 — End: 2023-02-27
  Administered 2023-02-27: 80 mg via INTRA_ARTICULAR

## 2023-02-27 MED ORDER — LIDOCAINE HCL 1 % IJ SOLN
4.0000 mL | INTRAMUSCULAR | Status: AC | PRN
Start: 2023-02-27 — End: 2023-02-27
  Administered 2023-02-27: 4 mL

## 2023-02-27 NOTE — Progress Notes (Signed)
The patient is a 67 year old patient well-known to [place].  He has known end-stage arthritis of his left hip and is in need of hip replacement surgery.  We saw him earlier this year for this and we were going to schedule surgery but he needed to have this held off.  He comes in today seeing if he can be set up for a steroid injection in his left hip.  He said there are some financial constraints that he is going through and still needs to hold off on hip replacement surgery.  He still deals with daily left hip pain.  On exam his left hip is able to rotate internally and externally but does have pain in the groin and again his previous x-rays show significant arthritis of his left hip.  We will see if we can get him seen soon by my partner Dr. Shon Baton for an intra-articular steroid injection under ultrasound in that left hip.  He will then let us know at some point when he would like to consider hip replacement surgery.

## 2023-02-27 NOTE — Progress Notes (Signed)
   Procedure Note  Patient: Tyler Coleman             Date of Birth: Jun 22, 1955           MRN: [id number]             Visit Date: 02/27/2023  Procedures: Visit Diagnoses:  1. Pain in left hip   2. Unilateral primary osteoarthritis, left hip    Large Joint Inj: L hip joint on 02/27/2023 10:11 AM Indications: pain Details: 22 G 3.5 in needle, ultrasound-guided anterior approach Medications: 4 mL lidocaine 1 %; 80 mg methylPREDNISolone acetate 40 MG/ML Outcome: tolerated well, no immediate complications  Procedure: US-guided intra-articular hip injection, left After discussion on risks/benefits/indications and informed verbal consent was obtained, a timeout was performed. Patient was lying supine on exam table. The hip was cleaned with betadine and alcohol swabs. Then utilizing ultrasound guidance, the patient's femoral head and neck junction was identified and subsequently injected with 4:2 lidocaine:depomedrol via an in-plane approach with ultrasound visualization of the injectate administered into the hip joint. Patient tolerated procedure well without immediate complications.  Procedure, treatment alternatives, risks and benefits explained, specific risks discussed. Consent was given by the patient. Immediately prior to procedure a time out was called to verify the correct patient, procedure, equipment, support staff and site/side marked as required. Patient was prepped and draped in the usual sterile fashion.     - follow-up with Dr. Magnus Ivan as indicated; I am happy to see them as needed  Madelyn Brunner, DO Primary Care Sports Medicine Physician  [facility]  This note was dictated using Dragon naturally speaking software and may contain errors in syntax, spelling, or content which have not been identified prior to signing this note.

## 2023-03-20 ENCOUNTER — Telehealth: Payer: Self-pay | Admitting: Physical Medicine and Rehabilitation

## 2023-03-20 NOTE — Telephone Encounter (Signed)
Pt called requesting a referral for Dr Alvester Morin to get back injection. Please call pt for new insurance for after 03/22/23. Pt phone number is [phone].

## 2023-03-21 ENCOUNTER — Other Ambulatory Visit: Payer: Self-pay | Admitting: Sports Medicine

## 2023-03-21 DIAGNOSIS — G8929 Other chronic pain: Secondary | ICD-10-CM

## 2023-03-23 ENCOUNTER — Telehealth: Payer: Self-pay | Admitting: Physical Medicine and Rehabilitation

## 2023-03-23 NOTE — Telephone Encounter (Signed)
 Pt called requesting an appt for back injection. Referral has been sent. Please leave voicemail if pt does not answer. Pt phone number is [phone].

## 2023-03-30 ENCOUNTER — Ambulatory Visit: Payer: Medicare Other | Admitting: Physical Medicine and Rehabilitation

## 2023-03-30 ENCOUNTER — Encounter: Payer: Self-pay | Admitting: Physical Medicine and Rehabilitation

## 2023-03-30 VITALS — BP 139/74

## 2023-03-30 DIAGNOSIS — M545 Low back pain, unspecified: Secondary | ICD-10-CM

## 2023-03-30 DIAGNOSIS — G8929 Other chronic pain: Secondary | ICD-10-CM | POA: Diagnosis not present

## 2023-03-30 NOTE — Progress Notes (Signed)
 Tyler Coleman - 68 y.o. male MRN [id number]  Date of birth: 1955/04/08  Office Visit Note: Visit Date: 03/30/2023 [facility] Referred by: Karenann Helms*  Subjective: Chief Complaint  Patient presents with   Lower Back - Pain   HPI: Tyler Coleman is a 68 y.o. male who comes in today per the request of Dr. Lonell Sprang for evaluation and discussion of possible injections. Patient reports chronic, worsening and severe bilateral lower back pain. Pain ongoing since early 20's.  His pain worsens with movement, activity and walking. He describes pain as sore and throbbing sensation, currently rates as 8 out of 10. Some relief of pain with home exercise regimen, rest and use of medications. No recent formal physical therapy. History of multiple lumbar surgeries in his 20's. History of lumbar epidural steroid injections  many years ago with good relief of pain. Lumbar MRI imaging from 2018 shows left laminectomy at L4-L5 without residual recurrent stenosis, broad based disc protrusion and facet hypertrophy at L3-L4. Patient seems frustrated upon arrival to his appointment today, states he was under the impression that his appointment was for lumbar epidural steroid injection. States he would prefer to return to his previous provider where he won't be asked any questions regarding his condition. At this point, patient got up and proceeded to walk out to lobby and left facility.      Review of Systems  Musculoskeletal:  Positive for back pain.  Neurological:  Negative for tingling, sensory change, focal weakness and weakness.  All other systems reviewed and are negative.  Otherwise per HPI.  Assessment & Plan: Visit Diagnoses:    ICD-10-CM   1. Chronic bilateral low back pain without sciatica  M54.50    G89.29        Plan: No additional findings.   Meds & Orders: No orders of the defined types were placed in this encounter.  No orders  of the defined types were placed in this encounter.   Follow-up: No follow-ups on file.   Procedures: No procedures performed      Clinical History: No specialty comments available.   He reports that he quit smoking about 41 years ago. His smoking use included cigarettes. He started smoking about 51 years ago. He has never been exposed to tobacco smoke. He has quit using smokeless tobacco. No results for input(s): HGBA1C, LABURIC in the last 8760 hours.  Objective:  VS:  HT:    WT:   BMI:     BP:139/74  HR: bpm  TEMP: ( )  RESP:  Physical Exam  Ortho Exam  Imaging: No results found.  Past Medical/Family/Surgical/Social History: Medications & Allergies reviewed per EMR, new medications updated. Patient Active Problem List   Diagnosis Date Noted   Unilateral primary osteoarthritis, left hip 01/25/2023   Dysphagia    Achalasia    S/P insertion of penile implant 12/22/2021   Alcohol use disorder, moderate, dependence (HCC) 03/16/2016   MDD (major depressive disorder), recurrent severe, without psychosis (HCC) 03/15/2016   Past Medical History:  Diagnosis Date   Alcohol use disorder, moderate, dependence (HCC)    per pcp note in epic    (12-09-2021  pt stated last drank alcohol 6 months ago)   Anxiety    Arthritis    Complication of anesthesia    12-09-2021  pt stated with ankle surgery in 1978 my lung froze and could not breath  told it was a medication that is no longer  used)   DDD (degenerative disc disease), lumbar    Delayed gastric emptying    Depression    Drug induced constipation    chronic   ED (erectile dysfunction)    Esophageal achalasia    DG esophagus imaging 09-24-2021--  seen by surgeon dr stevie, pt is scheduled for esophageal manometry 03-23-2022   GERD (gastroesophageal reflux disease)    Hypertension    Hypogonadism in male    Mixed hyperlipidemia    Opioid dependence on agonist therapy (HCC)    followed by pcp---   twice daily  suboxone    Polysubstance abuse (HCC)    cocaince, heroin, fentanyl , benzodiazepines, opiate addiction  (12-09-2021  pt stated stopped at age 56 but every 10 yrs ago he binge's,  stated last drug use was heroin IV narcon used by EMS (ED visit in epic)   Sleep apnea    Family History  Problem Relation Age of Onset   Heart failure Mother    Heart failure Father    Mental illness Neg Hx    Past Surgical History:  Procedure Laterality Date   ANKLE SURGERY Right 1978   per pt for fracture, has retained hardware   BACK SURGERY     COLONOSCOPY WITH PROPOFOL   06/22/2021   by dr shila   ESOPHAGEAL MANOMETRY N/A 01/05/2022   Procedure: ESOPHAGEAL MANOMETRY (EM);  Surgeon: Nandigam, Kavitha V, MD;  Location: [facility];  Service: Gastroenterology;  Laterality: N/A;   ESOPHAGOGASTRODUODENOSCOPY (EGD) WITH ESOPHAGEAL DILATION  2020   LUMBAR SPINE SURGERY     1980  and 1985;  including L4-5 lamenctomy   NASAL SINUS SURGERY     1990s   PENILE PROSTHESIS IMPLANT N/A 12/22/2021   Procedure: INSERTION OF MALLEABLE PENILE PROSTHESIS;  Surgeon: Lovie Arlyss CROME, MD;  Location: [facility];  Service: Urology;  Laterality: N/A;   PENILE PROSTHESIS IMPLANT N/A 12/13/2022   Procedure: REMOVAL AND REPLACEMENT OF MALLEABLE PENILE IMPLANT;  Surgeon: Lovie Arlyss CROME, MD;  Location: [facility];  Service: Urology;  Laterality: N/A;  90 MINUTES NEEDED FOR CASE   SHOULDER SURGERY Bilateral    1995 and 2000   Social History   Occupational History   Not on file  Tobacco Use   Smoking status: Former    Current packs/day: 0.00    Types: Cigarettes    Start date: 28    Quit date: 1984    Years since quitting: 41.0    Passive exposure: Never   Smokeless tobacco: Former  Building Services Engineer status: Never Used  Substance and Sexual Activity   Alcohol use: Not Currently    Comment: last use alcohol- 3-4 months ago   Drug use: Not Currently    Types: Cocaine, Heroin, Other-see comments,  Fentanyl , Benzodiazepines    Comment: last used 2 weeks ago - - ice or meth?   Sexual activity: Not on file

## 2023-03-30 NOTE — Progress Notes (Signed)
 Lower back pain  He wants to talk about getting an injection in his back.  He wanted to know if he can get shoulder injections if it gets worse.

## 2023-04-11 ENCOUNTER — Other Ambulatory Visit: Payer: Self-pay | Admitting: Physical Medicine and Rehabilitation

## 2023-04-11 DIAGNOSIS — M5459 Other low back pain: Secondary | ICD-10-CM

## 2023-04-12 DIAGNOSIS — M5416 Radiculopathy, lumbar region: Secondary | ICD-10-CM | POA: Diagnosis not present

## 2023-04-13 ENCOUNTER — Ambulatory Visit
Admission: RE | Admit: 2023-04-13 | Discharge: 2023-04-13 | Disposition: A | Discharge status: Home / Self Care | Payer: Medicare Other | Source: Ambulatory Visit | Attending: Physical Medicine and Rehabilitation | Admitting: Physical Medicine and Rehabilitation

## 2023-04-13 ENCOUNTER — Encounter: Payer: Self-pay | Admitting: Radiology

## 2023-04-13 DIAGNOSIS — M48061 Spinal stenosis, lumbar region without neurogenic claudication: Secondary | ICD-10-CM | POA: Diagnosis not present

## 2023-04-13 DIAGNOSIS — M5459 Other low back pain: Secondary | ICD-10-CM

## 2023-04-13 DIAGNOSIS — M47816 Spondylosis without myelopathy or radiculopathy, lumbar region: Secondary | ICD-10-CM | POA: Diagnosis not present

## 2023-04-24 DIAGNOSIS — M5416 Radiculopathy, lumbar region: Secondary | ICD-10-CM | POA: Diagnosis not present

## 2023-05-03 DIAGNOSIS — M5416 Radiculopathy, lumbar region: Secondary | ICD-10-CM | POA: Diagnosis not present

## 2023-05-05 DIAGNOSIS — R311 Benign essential microscopic hematuria: Secondary | ICD-10-CM | POA: Diagnosis not present

## 2023-05-23 ENCOUNTER — Telehealth: Payer: Self-pay

## 2023-05-23 NOTE — Telephone Encounter (Signed)
 Patient asking to have a second opinion with Dr. Alvester Morin. He is advising he had an MRI done aprox. 3 weeks ago and want to be evaluated by Dr. Alvester Morin, he also advised he saw [facility] NP, and that he want to establish care at [facility] and leave [facility]

## 2023-06-01 ENCOUNTER — Ambulatory Visit: Payer: Medicare Other | Admitting: Urology

## 2023-06-01 VITALS — BP 147/85 | HR 73 | Ht 71.0 in | Wt 223.0 lb

## 2023-06-01 DIAGNOSIS — R351 Nocturia: Secondary | ICD-10-CM | POA: Diagnosis not present

## 2023-06-01 DIAGNOSIS — I779 Disorder of arteries and arterioles, unspecified: Secondary | ICD-10-CM

## 2023-06-01 DIAGNOSIS — N521 Erectile dysfunction due to diseases classified elsewhere: Secondary | ICD-10-CM | POA: Diagnosis not present

## 2023-06-01 DIAGNOSIS — E291 Testicular hypofunction: Secondary | ICD-10-CM | POA: Diagnosis not present

## 2023-06-01 DIAGNOSIS — Z860100 Personal history of colon polyps, unspecified: Secondary | ICD-10-CM | POA: Insufficient documentation

## 2023-06-01 LAB — URINALYSIS, COMPLETE
Bilirubin, UA: NEGATIVE
Glucose, UA: NEGATIVE
Ketones, UA: NEGATIVE
Leukocytes,UA: NEGATIVE
Nitrite, UA: NEGATIVE
Protein,UA: NEGATIVE
RBC, UA: NEGATIVE
Specific Gravity, UA: 1.02 (ref 1.005–1.030)
Urobilinogen, Ur: 0.2 mg/dL (ref 0.2–1.0)
pH, UA: 6.5 (ref 5.0–7.5)

## 2023-06-01 LAB — MICROSCOPIC EXAMINATION

## 2023-06-01 LAB — BLADDER SCAN AMB NON-IMAGING: Scan Result: 18

## 2023-06-01 MED ORDER — GEMTESA 75 MG PO TABS: 1.0000 | ORAL_TABLET | Freq: Every day | ORAL | Status: DC

## 2023-06-01 NOTE — Progress Notes (Signed)
 Marcelle Overlie Plume,acting as a scribe for Vanna Scotland, MD.,have documented all relevant documentation on the behalf of Vanna Scotland, MD,as directed by  Vanna Scotland, MD while in the presence of Vanna Scotland, MD.  06/01/23 11:07 AM   Maryellen Pile 13-Nov-1955 [id number]  Referring provider: [facility] [address] [place] [address] [place],  [place] [address]  Chief Complaint  Patient presents with   Establish Care   Nocturia    HPI: 68 year old male who present today to transfer his urologic care, previously managed by [facility].   He has a personal history of erectile dysfunction status post malleable penile prosthesis placement in 2023, which was revised due to complications in 11/2022.  He also has a history of lower urinary tract symptoms and recurrent UTIs following the initial prosthesis placement. However, there was no evidence of urethral perforation or erosion. This presumably resolved after the remove and replace per Dr. Lafonda Mosses.   His primary complaint is nocturia, which he associates with symptoms of sleep apnea, though he has not undergone a formal workup due to financial constraints. He reports that Flomax was not helpful, but Uroxatral patches, which he uses every four days, have provided some relief per Dr. Lafonda Mosses. Most recently, finasteride was added to his regimen.   He was last seen in clinic in 04/2023. His most recent PSA taken that day was 0.63.   He also has a personal history of hypogonadism. He is currently on testosterone cypionate and injects 0.3 cc weekly. Per Dr. Lafonda Mosses, he is due for lab work in six months.   He is interested in exploring treatment options for sleep apnea, as he believes it may be contributing to his nocturia. He has tried CPAP in the past but found it intolerable due to claustrophobia.  He never actually had a sleep study rather, he used his son's old CPAP who unfortunately passed away from  fentanyl overdose.  He wonders if he would be a candidate for the newer implant.  His wife does endorse that he has apneic episodes at night and snores loudly.  He is not currently taking Flomax or Finasteride, despite previous prescriptions, and is only using Oxybutynin and the Uroxatral patch.   PMH: Past Medical History:  Diagnosis Date   Alcohol use disorder, moderate, dependence (HCC)    per pcp note in epic    (12-09-2021  pt stated last drank alcohol 6 months ago)   Anxiety    Arthritis    Complication of anesthesia    12-09-2021  pt stated with ankle surgery in 1978 "my lung froze and could not breath"  told it was a medication that is no longer used)   DDD (degenerative disc disease), lumbar    Delayed gastric emptying    Depression    Drug induced constipation    chronic   ED (erectile dysfunction)    Esophageal achalasia    DG esophagus imaging 09-24-2021--  seen by surgeon dr Sheliah Hatch, pt is scheduled for esophageal manometry 03-23-2022   GERD (gastroesophageal reflux disease)    Hypertension    Hypogonadism in male    Mixed hyperlipidemia    Opioid dependence on agonist therapy (HCC)    followed by pcp---   twice daily suboxone   Polysubstance abuse (HCC)    cocaince, heroin, fentanyl, benzodiazepines, opiate addiction  (12-09-2021  pt stated stopped at age 41 but every 10 yrs ago he binge's,  stated last drug use was heroin IV narcon  used by EMS (ED visit in epic)   Sleep apnea     Surgical History: Past Surgical History:  Procedure Laterality Date   ANKLE SURGERY Right 1978   per pt for fracture, has retained hardware   BACK SURGERY     COLONOSCOPY WITH PROPOFOL  06/22/2021   by dr Lavon Paganini   ESOPHAGEAL MANOMETRY N/A 01/05/2022   Procedure: ESOPHAGEAL MANOMETRY (EM);  Surgeon: Napoleon Form, MD;  Location: [facility];  Service: Gastroenterology;  Laterality: N/A;   ESOPHAGOGASTRODUODENOSCOPY (EGD) WITH ESOPHAGEAL DILATION  2020   LUMBAR SPINE SURGERY      1980  and 1985;  including L4-5 lamenctomy   NASAL SINUS SURGERY     1990s   PENILE PROSTHESIS IMPLANT N/A 12/22/2021   Procedure: INSERTION OF MALLEABLE PENILE PROSTHESIS;  Surgeon: Despina Arias, MD;  Location: [facility];  Service: Urology;  Laterality: N/A;   PENILE PROSTHESIS IMPLANT N/A 12/13/2022   Procedure: REMOVAL AND REPLACEMENT OF MALLEABLE PENILE IMPLANT;  Surgeon: Despina Arias, MD;  Location: [facility];  Service: Urology;  Laterality: N/A;  90 MINUTES NEEDED FOR CASE   SHOULDER SURGERY Bilateral    1995 and 2000    Home Medications:  Allergies as of 06/01/2023       Reactions   Augmentin [amoxicillin-pot Clavulanate] Other (See Comments)   Per pt severe burning of throat , "it got stuck" does not want it again   Doxycycline    Unknown reaction per pcp note   Erythromycin    Unknown reaction per pcp note   Thorazine [chlorpromazine] Swelling   "My throat swelled"        Medication List        Accurate as of June 01, 2023 11:07 AM. If you have any questions, ask your nurse or doctor.          STOP taking these medications    acetaminophen 500 MG tablet Commonly known as: TYLENOL   Cephalexin 500 MG tablet   fluocinonide ointment 0.05 % Commonly known as: LIDEX   ibuprofen 200 MG tablet Commonly known as: ADVIL   Melatonin 10 MG Tabs   MIRALAX PO   mupirocin ointment 2 % Commonly known as: BACTROBAN   Narcan 4 MG/0.1ML Liqd nasal spray kit Generic drug: naloxone   ondansetron 4 MG disintegrating tablet Commonly known as: ZOFRAN-ODT   oxyCODONE 5 MG/5ML solution Commonly known as: ROXICODONE   psyllium 58.6 % powder Commonly known as: METAMUCIL   ROLAIDS PO   sodium chloride 0.65 % Soln nasal spray Commonly known as: OCEAN   Suboxone 12-3 MG Film Generic drug: Buprenorphine HCl-Naloxone HCl   sulfamethoxazole-trimethoprim 800-160 MG tablet Commonly known as: BACTRIM DS       TAKE these medications     acyclovir ointment 5 % Commonly known as: ZOVIRAX Apply topically. Apply topically Every 3 (three) hours while awake. Prn   AZO CRANBERRY PO Take by mouth.   benazepril 10 MG tablet Commonly known as: LOTENSIN Take 10 mg by mouth daily.   citalopram 20 MG tablet Commonly known as: CELEXA Take 30 mg by mouth daily.   clonazePAM 0.5 MG tablet Commonly known as: KLONOPIN Take 0.5 mg by mouth 3 (three) times daily. What changed: Another medication with the same name was removed. Continue taking this medication, and follow the directions you see here.   clotrimazole-betamethasone cream Commonly known as: LOTRISONE Apply 1 Application topically 2 (two) times daily.   Depo-Testosterone 200 MG/ML injection Generic  drug: testosterone cypionate Inject into the muscle. 0.3 ml once a week   fluticasone 50 MCG/ACT nasal spray Commonly known as: FLONASE Place 1 spray into both nostrils daily as needed for allergies.   Gemtesa 75 MG Tabs Generic drug: Vibegron Take 1 tablet (75 mg total) by mouth daily.   ketoconazole 2 % cream Commonly known as: NIZORAL Apply 1 Application topically daily as needed for irritation.   Linzess 290 MCG Caps capsule Generic drug: linaclotide Take 290 mcg by mouth daily as needed (constipation).   meclizine 25 MG tablet Commonly known as: ANTIVERT Take 25 mg by mouth 3 (three) times daily as needed.   oxybutynin 3.9 MG/24HR Commonly known as: OXYTROL Place 1 patch onto the skin every 3 (three) days.   pantoprazole 40 MG tablet Commonly known as: PROTONIX Take 80 mg by mouth daily.   pramipexole 0.125 MG tablet Commonly known as: MIRAPEX Take 0.125 mg by mouth daily.   tamsulosin 0.4 MG Caps capsule Commonly known as: FLOMAX Take 0.4 mg by mouth at bedtime.   triamcinolone cream 0.1 % Commonly known as: KENALOG Apply 1 Application topically 2 (two) times daily.        Allergies:  Allergies  Allergen Reactions   Augmentin  [Amoxicillin-Pot Clavulanate] Other (See Comments)    Per pt severe burning of throat , "it got stuck" does not want it again   Doxycycline     Unknown reaction per pcp note   Erythromycin     Unknown reaction per pcp note   Thorazine [Chlorpromazine] Swelling    "My throat swelled"    Family History: Family History  Problem Relation Age of Onset   Heart failure Mother    Heart failure Father    Mental illness Neg Hx     Social History:  reports that he quit smoking about 41 years ago. His smoking use included cigarettes. He started smoking about 51 years ago. He has never been exposed to tobacco smoke. He has quit using smokeless tobacco. He reports that he does not currently use alcohol. He reports that he does not currently use drugs after having used the following drugs: Cocaine, Heroin, Other-see comments, Fentanyl, and Benzodiazepines.   Physical Exam: BP (!) 147/85   Pulse 73   Ht 5\' 11"  (1.803 m)   Wt 223 lb (101.2 kg)   BMI 31.10 kg/m   Constitutional:  Alert and oriented, No acute distress. HEENT: [place] AT, moist mucus membranes.  Trachea midline, no masses. Neurologic: Grossly intact, no focal deficits, moving all 4 extremities. Psychiatric: Normal mood and affect.   Assessment & Plan:    1. Nocturia - He reports significant nocturia, with up to six episodes per night. He has a history of lower urinary tract symptoms and recurrent UTIs post-penile prosthesis placement. He has tried Flomax and Uroxatral patches, with the latter providing some relief.  - Likely that untreated sleep apnea may be contributing to the nocturia due to hormonal changes affecting fluid management. He is advised to discuss a sleep study with his primary care provider to evaluate for sleep apnea.  We also had a lengthy conversation today about the pathophysiology of sleep apnea and how it relates to urinary symptoms at nighttime, primarily primary nocturia. - Samples of Gemtesa are provided to try  at bedtime to increase bladder capacity temporarily.  2. Erectile Dysfunction Status Post-Malleable Penile Prosthesis - He underwent revision of his malleable penile prosthesis in 2023. He expresses dissatisfaction with the outcome and the  previous urologist's communication. No new interventions are planned at this time.  3. Hypogonadism - He is on testosterone cypionate injections, 0.3 cc weekly. He resumed injections two weeks ago.  - The plan includes scheduling lab work in six months to monitor testosterone levels and PSA.  - We will take over the prescription management if he transfers care.   Return in about 6 months (around 12/02/2023) for CBC, PSA, testosterone.  I have reviewed the above documentation for accuracy and completeness, and I agree with the above.   Vanna Scotland, MD    [facility] [address], [address] [place], [place] [address] [phone]

## 2023-06-06 ENCOUNTER — Encounter: Payer: Self-pay | Admitting: Physical Medicine and Rehabilitation

## 2023-06-06 ENCOUNTER — Ambulatory Visit: Admitting: Physical Medicine and Rehabilitation

## 2023-06-06 DIAGNOSIS — M48062 Spinal stenosis, lumbar region with neurogenic claudication: Secondary | ICD-10-CM | POA: Diagnosis not present

## 2023-06-06 DIAGNOSIS — G8929 Other chronic pain: Secondary | ICD-10-CM | POA: Diagnosis not present

## 2023-06-06 DIAGNOSIS — M545 Low back pain, unspecified: Secondary | ICD-10-CM | POA: Diagnosis not present

## 2023-06-06 DIAGNOSIS — M961 Postlaminectomy syndrome, not elsewhere classified: Secondary | ICD-10-CM | POA: Diagnosis not present

## 2023-06-06 DIAGNOSIS — M47816 Spondylosis without myelopathy or radiculopathy, lumbar region: Secondary | ICD-10-CM | POA: Diagnosis not present

## 2023-06-06 NOTE — Progress Notes (Unsigned)
 Tyler Coleman - 68 y.o. male MRN [id number]  Date of birth: 02/22/1956  Office Visit Note: Visit Date: 06/06/2023 PCP: [facility] Referred by: Roe Coombs*  Subjective: Chief Complaint  Patient presents with   Lower Back - Pain   HPI: Tyler Coleman is a 68 y.o. male who comes in todayHPI   ROS Otherwise per HPI.  Assessment & Plan: Visit Diagnoses:    ICD-10-CM   1. Spinal stenosis of lumbar region with neurogenic claudication  M48.062 Ambulatory referral to Physical Medicine Rehab    2. Spondylosis without myelopathy or radiculopathy, lumbar region  M47.816     3. Chronic bilateral low back pain without sciatica  M54.50    G89.29     4. Post laminectomy syndrome  M96.1        Plan: No additional findings.   Meds & Orders: No orders of the defined types were placed in this encounter.   Orders Placed This Encounter  Procedures   Ambulatory referral to Physical Medicine Rehab    Follow-up: No follow-ups on file.   Procedures: No procedures performed      Clinical History: MRI LUMBAR SPINE WITHOUT CONTRAST   TECHNIQUE: Multiplanar, multisequence MR imaging of the lumbar spine was performed. No intravenous contrast was administered.   COMPARISON:  Lumbar MRI 03/19/2017. CT of the chest, abdomen and pelvis 01/16/2010.   FINDINGS: Segmentation: Transitional lumbosacral anatomy. In correlation with prior imaging, there are 12 rib-bearing thoracic type vertebral bodies and a transitional, nearly fully lumbarized S1 segment.This differs from the numbering applied to the previous lumbar MRI.   Alignment:  Physiologic.   Vertebrae: No worrisome osseous lesion, acute fracture or pars defect. Progressive multilevel endplate degenerative changes with prominent Modic 1 changes asymmetric to the left at L1-2. No evidence of discitis or osteomyelitis. The lumbar pedicles are diffusely short on a congenital  basis. The visualized sacroiliac joints appear unremarkable.   Conus medullaris: Extends to the L1 level. The conus and cauda equina appear normal.   Paraspinal and other soft tissues: No significant paraspinal findings.   Disc levels:   Sagittal images demonstrate no significant disc space findings within the visualized lower thoracic spine.   L1-2: New loss of disc height with annular disc bulging, endplate osteophytes and endplate edema asymmetric to the left. Mild facet and ligamentous hypertrophy. Mild spinal stenosis with mild right and moderate left foraminal narrowing.   L2-3: New moderate loss of disc height with annular disc bulging and endplate osteophytes. Moderate facet and ligamentous hypertrophy. Resulting moderate multifactorial spinal stenosis with mild lateral recess, mild right and moderate left foraminal narrowing.   L3-4: Relatively preserved disc height at this level with moderate annular disc bulging, facet and ligamentous hypertrophy. Resulting mild multifactorial spinal stenosis with mild narrowing of the lateral recesses and left foramen.   L4-5: Mildly progressive loss of disc height with annular disc bulging and endplate osteophytes asymmetric to the right. Mild facet and ligamentous hypertrophy. No significant spinal stenosis. There is mild right foraminal narrowing.   L5-S1: Chronic degenerative disc disease with loss of disc height and endplate osteophytes. There are remote postsurgical changes on the left. The spinal canal remains decompressed. There is mild chronic narrowing of the lateral recesses and right foramen.   S1-2: Progressive loss of disc height with annular disc bulging and endplate osteophytes. Mild bilateral facet hypertrophy. The spinal canal is patent. There is mild lateral recess and foraminal narrowing bilaterally.   IMPRESSION: 1.  Transitional lumbosacral anatomy with a transitional, nearly fully lumbarized S1  segment. 2. No acute findings or clear explanation for the patient's symptoms. 3. 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 Prominent endplate degenerative changes at L1-2 and L2-3 may contribute to back pain. 4. Moderate multifactorial spinal stenosis at L2-3 with moderate left foraminal narrowing. 5. Mild multifactorial spinal stenosis at L1-2 and L3-4 with asymmetric left foraminal narrowing at both levels. 6. Chronic postsurgical changes and degenerative disc disease at L5-S1 with mild chronic narrowing of the lateral recesses and right foramen. 7. Progressive degenerative disc disease at S1-2 with mild lateral recess and foraminal narrowing bilaterally.     Electronically Signed   By: Carey Bullocks M.D.   On: 04/23/2023 11:36   He reports that he quit smoking about 41 years ago. His smoking use included cigarettes. He started smoking about 51 years ago. He has never been exposed to tobacco smoke. He has quit using smokeless tobacco. No results for input(s): "HGBA1C", "LABURIC" in the last 8760 hours.  Objective:  VS:  HT:    WT:   BMI:     BP:   HR: bpm  TEMP: ( )  RESP:  Physical Exam  Ortho Exam  Imaging: No results found.  Past Medical/Family/Surgical/Social History: Medications & Allergies reviewed per EMR, new medications updated. Patient Active Problem List   Diagnosis Date Noted   History of colonic polyps 06/01/2023   Unilateral primary osteoarthritis, left hip 01/25/2023   Dysphagia    Achalasia    S/P insertion of penile implant 12/22/2021   Erectile dysfunction due to arterial disease (HCC) 07/20/2021   Seborrheic keratoses 03/31/2019   Actinic keratosis 03/31/2019   Essential hypertension 02/17/2017   Alcohol use disorder, moderate, dependence (HCC) 03/16/2016   MDD (major depressive disorder), recurrent severe, without psychosis (HCC) 03/15/2016   Mixed hyperlipidemia 02/16/2016   Allergic rhinitis  05/13/2015   Testicular hypofunction 05/13/2015   Insomnia 05/13/2015   Past Medical History:  Diagnosis Date   Alcohol use disorder, moderate, dependence (HCC)    per pcp note in epic    (12-09-2021  pt stated last drank alcohol 6 months ago)   Anxiety    Arthritis    Complication of anesthesia    12-09-2021  pt stated with ankle surgery in 1978 "my lung froze and could not breath"  told it was a medication that is no longer used)   DDD (degenerative disc disease), lumbar    Delayed gastric emptying    Depression    Drug induced constipation    chronic   ED (erectile dysfunction)    Esophageal achalasia    DG esophagus imaging 09-24-2021--  seen by surgeon dr Sheliah Hatch, pt is scheduled for esophageal manometry 03-23-2022   GERD (gastroesophageal reflux disease)    Hypertension    Hypogonadism in male    Mixed hyperlipidemia    Opioid dependence on agonist therapy (HCC)    followed by pcp---   twice daily suboxone   Polysubstance abuse (HCC)    cocaince, heroin, fentanyl, benzodiazepines, opiate addiction  (12-09-2021  pt stated stopped at age 21 but every 10 yrs ago he binge's,  stated last drug use was heroin IV narcon used by EMS (ED visit in epic)   Sleep apnea    Family History  Problem Relation Age of Onset   Heart failure Mother    Heart failure Father    Mental illness Neg Hx    Past Surgical History:  Procedure Laterality Date   ANKLE SURGERY Right 1978   per pt for fracture, has retained hardware   BACK SURGERY     COLONOSCOPY WITH PROPOFOL  06/22/2021   by dr Lavon Paganini   ESOPHAGEAL MANOMETRY N/A 01/05/2022   Procedure: ESOPHAGEAL MANOMETRY (EM);  Surgeon: Napoleon Form, MD;  Location: [facility];  Service: Gastroenterology;  Laterality: N/A;   ESOPHAGOGASTRODUODENOSCOPY (EGD) WITH ESOPHAGEAL DILATION  2020   LUMBAR SPINE SURGERY     1980  and 1985;  including L4-5 lamenctomy   NASAL SINUS SURGERY     1990s   PENILE PROSTHESIS IMPLANT N/A 12/22/2021    Procedure: INSERTION OF MALLEABLE PENILE PROSTHESIS;  Surgeon: Despina Arias, MD;  Location: [facility];  Service: Urology;  Laterality: N/A;   PENILE PROSTHESIS IMPLANT N/A 12/13/2022   Procedure: REMOVAL AND REPLACEMENT OF MALLEABLE PENILE IMPLANT;  Surgeon: Despina Arias, MD;  Location: [facility];  Service: Urology;  Laterality: N/A;  90 MINUTES NEEDED FOR CASE   SHOULDER SURGERY Bilateral    1995 and 2000   Social History   Occupational History   Not on file  Tobacco Use   Smoking status: Former    Current packs/day: 0.00    Types: Cigarettes    Start date: 59    Quit date: 1984    Years since quitting: 41.2    Passive exposure: Never   Smokeless tobacco: Former  Building services engineer status: Never Used  Substance and Sexual Activity   Alcohol use: Not Currently    Comment: last use alcohol- 3-4 months ago   Drug use: Not Currently    Types: Cocaine, Heroin, Other-see comments, Fentanyl, Benzodiazepines    Comment: last used 2 weeks ago - - ice or meth?   Sexual activity: Not on file

## 2023-06-06 NOTE — Progress Notes (Signed)
 Pain Scale   Average Pain 9        +Driver, -BT, -Dye Allergies.

## 2023-06-07 ENCOUNTER — Telehealth: Payer: Self-pay

## 2023-06-07 NOTE — Telephone Encounter (Signed)
 Pt LM on triage line-Can't afford Gemtesa  Called pt- s/w wife - Clyde Lundborg  is too expensive - [phone]. Pt is requesting an alternative.   LV -3/13  Rxed for Nocturia   Pharmacy- [facility] [address]  Pls advise.

## 2023-06-08 NOTE — Telephone Encounter (Signed)
 Spoke with [name] and she was not sure how much samples patient had or if he is taking it, they called their insurance and asked for the price for when the time came for a prescription. She will have patient call us to confirm if he is taking the medication and make sure that he has enough samples to give it time to see if the medication is helpful and then we can switch to something more affordable based on how he response to the treatment.

## 2023-06-08 NOTE — Telephone Encounter (Signed)
 Could you please confirm whether or not he was given samples and if not, please have him swing by and pick them up if we have any available.  Otherwise, we can try Myrbetriq 25 mg if this is on his formulary more cost effective.  Vanna Scotland, MD

## 2023-06-19 ENCOUNTER — Encounter: Admitting: Physical Medicine and Rehabilitation

## 2023-06-28 ENCOUNTER — Other Ambulatory Visit: Payer: Self-pay

## 2023-06-28 ENCOUNTER — Telehealth: Payer: Self-pay | Admitting: Urology

## 2023-06-28 ENCOUNTER — Ambulatory Visit: Admitting: Physical Medicine and Rehabilitation

## 2023-06-28 VITALS — BP 131/79 | HR 72

## 2023-06-28 DIAGNOSIS — M48062 Spinal stenosis, lumbar region with neurogenic claudication: Secondary | ICD-10-CM | POA: Diagnosis not present

## 2023-06-28 DIAGNOSIS — R351 Nocturia: Secondary | ICD-10-CM

## 2023-06-28 MED ORDER — METHYLPREDNISOLONE ACETATE 40 MG/ML IJ SUSP
40.0000 mg | Freq: Once | INTRAMUSCULAR | Status: AC
Start: 2023-06-28 — End: 2023-06-28
  Administered 2023-06-28: 40 mg

## 2023-06-28 NOTE — Telephone Encounter (Signed)
 Myrbetriq and Leslye Peer are not on preferred formulary for the patient. What other medications can patient try or check on for pricing? Or any OAB medications?

## 2023-06-28 NOTE — Procedures (Signed)
 Lumbar Epidural Steroid Injection - Interlaminar Approach with Fluoroscopic Guidance  Patient: Tyler Coleman      Date of Birth: [birth year] MRN: [id number] PCP: [facility]      Visit Date: 06/28/2023   Universal Protocol:     Consent Given By: the patient  Position: PRONE  Additional Comments: Vital signs were monitored before and after the procedure. Patient was prepped and draped in the usual sterile fashion. The correct patient, procedure, and site was verified.   Injection Procedure Details:   Procedure diagnoses: Spinal stenosis of lumbar region with neurogenic claudication [M48.062]   Meds Administered:  Meds ordered this encounter  Medications   methylPREDNISolone acetate (DEPO-MEDROL) injection 40 mg     Laterality: Midline  Location/Site:  L3-4, S1 is lumbarized  Needle: 3.5 in., 20 ga. Tuohy  Needle Placement: Paramedian epidural  Findings:   -Comments: Excellent flow of contrast into the epidural space.  Procedure Details: Using a paramedian approach from the side mentioned above, the region overlying the inferior lamina was localized under fluoroscopic visualization and the soft tissues overlying this structure were infiltrated with 4 ml. of 1% Lidocaine without Epinephrine. The Tuohy needle was inserted into the epidural space using a paramedian approach.   The epidural space was localized using loss of resistance along with counter oblique bi-planar fluoroscopic views.  After negative aspirate for air, blood, and CSF, a 2 ml. volume of Isovue-250 was injected into the epidural space and the flow of contrast was observed. Radiographs were obtained for documentation purposes.    The injectate was administered into the level noted above.   Additional Comments:  The patient tolerated the procedure well Dressing: 2 x 2 sterile gauze and Band-Aid    Post-procedure details: Patient was observed during the  procedure. Post-procedure instructions were reviewed.  Patient left the clinic in stable condition.

## 2023-06-28 NOTE — Telephone Encounter (Signed)
 Patient had spoken with Access Nurse line and requested that he get a call back from our office. I called patient and spoke with his wife Drinda Butts). She said the Leslye Peer is too expensive (around $300). She asked if we had any more samples, or if he could get a prescription for less expensive medication. Please advise patient.

## 2023-06-28 NOTE — Progress Notes (Signed)
 Tyler Coleman - 68 y.o. male MRN [id number]  Date of birth: 11/25/1955  Office Visit Note: Visit Date: 06/28/2023 PCP: [facility] Referred by: Silvester Drown*  Subjective: Chief Complaint  Patient presents with   Lower Back - Pain   HPI:  Tyler Coleman is a 68 y.o. male who comes in today at the request of Elvan Hamel, FNP for planned Midline L3-4 Lumbar Interlaminar epidural steroid injection with fluoroscopic guidance.  The patient has failed conservative care including home exercise, medications, time and activity modification.  This injection will be diagnostic and hopefully therapeutic.  Please see requesting physician notes for further details and justification.   ROS Otherwise per HPI.  Assessment & Plan: Visit Diagnoses:    ICD-10-CM   1. Spinal stenosis of lumbar region with neurogenic claudication  M48.062 XR C-ARM NO REPORT    Epidural Steroid injection    methylPREDNISolone acetate (DEPO-MEDROL) injection 40 mg      Plan: No additional findings.   Meds & Orders:  Meds ordered this encounter  Medications   methylPREDNISolone acetate (DEPO-MEDROL) injection 40 mg    Orders Placed This Encounter  Procedures   XR C-ARM NO REPORT   Epidural Steroid injection    Follow-up: Return if symptoms worsen or fail to improve.   Procedures: No procedures performed  Lumbar Epidural Steroid Injection - Interlaminar Approach with Fluoroscopic Guidance  Patient: Tyler Coleman      Date of Birth: November 13, 1955 MRN: [id number] PCP: [facility]      Visit Date: 06/28/2023   Universal Protocol:     Consent Given By: the patient  Position: PRONE  Additional Comments: Vital signs were monitored before and after the procedure. Patient was prepped and draped in the usual sterile fashion. The correct patient, procedure, and site was verified.   Injection Procedure Details:    Procedure diagnoses: Spinal stenosis of lumbar region with neurogenic claudication [M48.062]   Meds Administered:  Meds ordered this encounter  Medications   methylPREDNISolone acetate (DEPO-MEDROL) injection 40 mg     Laterality: Midline  Location/Site:  L3-4, S1 is lumbarized  Needle: 3.5 in., 20 ga. Tuohy  Needle Placement: Paramedian epidural  Findings:   -Comments: Excellent flow of contrast into the epidural space.  Procedure Details: Using a paramedian approach from the side mentioned above, the region overlying the inferior lamina was localized under fluoroscopic visualization and the soft tissues overlying this structure were infiltrated with 4 ml. of 1% Lidocaine without Epinephrine. The Tuohy needle was inserted into the epidural space using a paramedian approach.   The epidural space was localized using loss of resistance along with counter oblique bi-planar fluoroscopic views.  After negative aspirate for air, blood, and CSF, a 2 ml. volume of Isovue-250 was injected into the epidural space and the flow of contrast was observed. Radiographs were obtained for documentation purposes.    The injectate was administered into the level noted above.   Additional Comments:  The patient tolerated the procedure well Dressing: 2 x 2 sterile gauze and Band-Aid    Post-procedure details: Patient was observed during the procedure. Post-procedure instructions were reviewed.  Patient left the clinic in stable condition.   Clinical History: MRI LUMBAR SPINE WITHOUT CONTRAST   TECHNIQUE: Multiplanar, multisequence MR imaging of the lumbar spine was performed. No intravenous contrast was administered.   COMPARISON:  Lumbar MRI 03/19/2017. CT of the chest, abdomen and pelvis 01/16/2010.   FINDINGS: Segmentation: Transitional  lumbosacral anatomy. In correlation with prior imaging, there are 12 rib-bearing thoracic type vertebral bodies and a transitional, nearly fully  lumbarized S1 segment.This differs from the numbering applied to the previous lumbar MRI.   Alignment:  Physiologic.   Vertebrae: No worrisome osseous lesion, acute fracture or pars defect. Progressive multilevel endplate degenerative changes with prominent Modic 1 changes asymmetric to the left at L1-2. No evidence of discitis or osteomyelitis. The lumbar pedicles are diffusely short on a congenital basis. The visualized sacroiliac joints appear unremarkable.   Conus medullaris: Extends to the L1 level. The conus and cauda equina appear normal.   Paraspinal and other soft tissues: No significant paraspinal findings.   Disc levels:   Sagittal images demonstrate no significant disc space findings within the visualized lower thoracic spine.   L1-2: New loss of disc height with annular disc bulging, endplate osteophytes and endplate edema asymmetric to the left. Mild facet and ligamentous hypertrophy. Mild spinal stenosis with mild right and moderate left foraminal narrowing.   L2-3: New moderate loss of disc height with annular disc bulging and endplate osteophytes. Moderate facet and ligamentous hypertrophy. Resulting moderate multifactorial spinal stenosis with mild lateral recess, mild right and moderate left foraminal narrowing.   L3-4: Relatively preserved disc height at this level with moderate annular disc bulging, facet and ligamentous hypertrophy. Resulting mild multifactorial spinal stenosis with mild narrowing of the lateral recesses and left foramen.   L4-5: Mildly progressive loss of disc height with annular disc bulging and endplate osteophytes asymmetric to the right. Mild facet and ligamentous hypertrophy. No significant spinal stenosis. There is mild right foraminal narrowing.   L5-S1: Chronic degenerative disc disease with loss of disc height and endplate osteophytes. There are remote postsurgical changes on the left. The spinal canal remains decompressed.  There is mild chronic narrowing of the lateral recesses and right foramen.   S1-2: Progressive loss of disc height with annular disc bulging and endplate osteophytes. Mild bilateral facet hypertrophy. The spinal canal is patent. There is mild lateral recess and foraminal narrowing bilaterally.   IMPRESSION: 1. Transitional lumbosacral anatomy with a transitional, nearly fully lumbarized S1 segment. 2. No acute findings or clear explanation for the patient's symptoms. 3. 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 Prominent endplate degenerative changes at L1-2 and L2-3 may contribute to back pain. 4. Moderate multifactorial spinal stenosis at L2-3 with moderate left foraminal narrowing. 5. Mild multifactorial spinal stenosis at L1-2 and L3-4 with asymmetric left foraminal narrowing at both levels. 6. Chronic postsurgical changes and degenerative disc disease at L5-S1 with mild chronic narrowing of the lateral recesses and right foramen. 7. Progressive degenerative disc disease at S1-2 with mild lateral recess and foraminal narrowing bilaterally.     Electronically Signed   By: Elmon Hagedorn M.D.   On: 04/23/2023 11:36     Objective:  VS:  HT:    WT:   BMI:     BP:131/79  HR:72bpm  TEMP: ( )  RESP:  Physical Exam Vitals and nursing note reviewed.  Constitutional:      General: He is not in acute distress.    Appearance: Normal appearance. He is not ill-appearing.  HENT:     Head: Normocephalic and atraumatic.     Right Ear: External ear normal.     Left Ear: External ear normal.     Nose: No congestion.  Eyes:     Extraocular Movements: Extraocular movements intact.  Cardiovascular:     Rate  and Rhythm: Normal rate.     Pulses: Normal pulses.  Pulmonary:     Effort: Pulmonary effort is normal. No respiratory distress.  Abdominal:     General: There is no distension.     Palpations: Abdomen is soft.   Musculoskeletal:        General: No tenderness or signs of injury.     Cervical back: Neck supple.     Right lower leg: No edema.     Left lower leg: No edema.     Comments: Patient has good distal strength without clonus.  Skin:    Findings: No erythema or rash.  Neurological:     General: No focal deficit present.     Mental Status: He is alert and oriented to person, place, and time.     Sensory: No sensory deficit.     Motor: No weakness or abnormal muscle tone.     Coordination: Coordination normal.  Psychiatric:        Mood and Affect: Mood normal.        Behavior: Behavior normal.      Imaging: No results found.

## 2023-06-28 NOTE — Patient Instructions (Signed)

## 2023-06-28 NOTE — Progress Notes (Signed)
 Pain Scale    Average Pain 7 Patient advising that he has pain when walking up inclines And advised that walking seems to make it worse/ getting up from seated position is very pain full,

## 2023-07-26 DIAGNOSIS — J310 Chronic rhinitis: Secondary | ICD-10-CM | POA: Diagnosis not present

## 2023-07-26 DIAGNOSIS — R0683 Snoring: Secondary | ICD-10-CM | POA: Diagnosis not present

## 2023-07-26 DIAGNOSIS — T485X5A Adverse effect of other anti-common-cold drugs, initial encounter: Secondary | ICD-10-CM | POA: Diagnosis not present

## 2023-07-28 ENCOUNTER — Telehealth: Payer: Self-pay

## 2023-07-28 ENCOUNTER — Other Ambulatory Visit: Payer: Self-pay | Admitting: Physical Medicine and Rehabilitation

## 2023-07-28 DIAGNOSIS — M5416 Radiculopathy, lumbar region: Secondary | ICD-10-CM

## 2023-07-28 DIAGNOSIS — G894 Chronic pain syndrome: Secondary | ICD-10-CM

## 2023-07-28 DIAGNOSIS — M48062 Spinal stenosis, lumbar region with neurogenic claudication: Secondary | ICD-10-CM

## 2023-07-28 NOTE — Telephone Encounter (Signed)
 Patient requesting to go to pain management

## 2023-07-28 NOTE — Telephone Encounter (Signed)
 Last injection 05/2022 %50 relief/ function ability Duration of relief /improvement---3 weeks Current pain score--9 No recent falls or injuries--NONE Location of pain---lower back bilateral legs

## 2023-07-31 ENCOUNTER — Telehealth: Payer: Self-pay

## 2023-07-31 DIAGNOSIS — Z23 Encounter for immunization: Secondary | ICD-10-CM | POA: Diagnosis not present

## 2023-07-31 DIAGNOSIS — Z0000 Encounter for general adult medical examination without abnormal findings: Secondary | ICD-10-CM | POA: Diagnosis not present

## 2023-07-31 DIAGNOSIS — E7800 Pure hypercholesterolemia, unspecified: Secondary | ICD-10-CM | POA: Diagnosis not present

## 2023-07-31 DIAGNOSIS — I10 Essential (primary) hypertension: Secondary | ICD-10-CM | POA: Diagnosis not present

## 2023-07-31 DIAGNOSIS — R739 Hyperglycemia, unspecified: Secondary | ICD-10-CM | POA: Diagnosis not present

## 2023-07-31 NOTE — Telephone Encounter (Signed)
Patient requesting referral to Pain Clinic

## 2023-08-02 ENCOUNTER — Encounter: Admitting: Physical Medicine and Rehabilitation

## 2023-08-02 ENCOUNTER — Telehealth: Payer: Self-pay

## 2023-08-02 NOTE — Telephone Encounter (Signed)
 Patient arrived at 9am today and front desk staff called Odilia Bennett and advised patient needed a wheelchair, IOctavia Belton) told my co-worker Odilia Bennett I would go get the wheelchair for patient. Odilia Bennett then advised me that the patient arrived early for his appointment(3:15pm)  and when I spoke to patient and advised him that his appointment was at 3:15 he would have to either wait for a poss cancellation or return for his scheduled appointment. The patient then became angry and states that he was called later yesterday and told that he could come in at 9 am. I advised the patient that I am the scheduler for Dr. Daisey Dryer, and I only called him and offered him the 3:15 PM appointment and advised him to be at the office at 3pm. Patient then states that I lied to him and told him to be in at 9am,  Patient advised he would not be back and called Me a "#itch", I walked away and returned to my work area.

## 2023-08-08 NOTE — Telephone Encounter (Signed)
 He previously used oxybutynin patches.  This comes in an oral form.  We could try oxybutynin 15 mg XL or he can check his formulary for other alternatives OAB medications.  Dustin Gimenez, MD

## 2023-08-09 MED ORDER — OXYBUTYNIN CHLORIDE ER 15 MG PO TB24: 15.0000 mg | ORAL_TABLET | Freq: Every day | ORAL | 11 refills | Status: DC

## 2023-08-09 NOTE — Addendum Note (Signed)
 Addended by: Natha Bair on: 08/09/2023 04:06 PM   Modules accepted: Orders

## 2023-08-09 NOTE — Telephone Encounter (Signed)
 Tyler Coleman advised that Oxybutynin 15 mg sent to the pharmacy and to let us  know if there are any issues with this medication.

## 2023-08-17 ENCOUNTER — Encounter: Admitting: Physical Medicine and Rehabilitation

## 2023-08-24 DIAGNOSIS — L2989 Other pruritus: Secondary | ICD-10-CM | POA: Diagnosis not present

## 2023-08-24 DIAGNOSIS — Z789 Other specified health status: Secondary | ICD-10-CM | POA: Diagnosis not present

## 2023-08-24 DIAGNOSIS — L814 Other melanin hyperpigmentation: Secondary | ICD-10-CM | POA: Diagnosis not present

## 2023-08-24 DIAGNOSIS — S60511A Abrasion of right hand, initial encounter: Secondary | ICD-10-CM | POA: Diagnosis not present

## 2023-08-24 DIAGNOSIS — Z85828 Personal history of other malignant neoplasm of skin: Secondary | ICD-10-CM | POA: Diagnosis not present

## 2023-08-24 DIAGNOSIS — S80812A Abrasion, left lower leg, initial encounter: Secondary | ICD-10-CM | POA: Diagnosis not present

## 2023-08-24 DIAGNOSIS — L821 Other seborrheic keratosis: Secondary | ICD-10-CM | POA: Diagnosis not present

## 2023-08-24 DIAGNOSIS — D225 Melanocytic nevi of trunk: Secondary | ICD-10-CM | POA: Diagnosis not present

## 2023-08-24 DIAGNOSIS — L820 Inflamed seborrheic keratosis: Secondary | ICD-10-CM | POA: Diagnosis not present

## 2023-08-24 DIAGNOSIS — Z08 Encounter for follow-up examination after completed treatment for malignant neoplasm: Secondary | ICD-10-CM | POA: Diagnosis not present

## 2023-08-24 DIAGNOSIS — L538 Other specified erythematous conditions: Secondary | ICD-10-CM | POA: Diagnosis not present

## 2023-09-19 ENCOUNTER — Other Ambulatory Visit: Payer: Self-pay

## 2023-09-19 ENCOUNTER — Telehealth: Payer: Self-pay | Admitting: Urology

## 2023-09-19 NOTE — Telephone Encounter (Signed)
 Patient's wife called and inquired whether the patient could be prescribed Oxybutynin  (Oxytrol ) in pill form instead of patches. She mentioned that Oxybutynin  (Ditropan ) is too strong for the patient. The patient's wife is requesting a call back for clarification

## 2023-09-19 NOTE — Telephone Encounter (Signed)
 Per pts wife the 15 mg tablet of oxybutynin  is too strong.   He likes the patch but it irritates his skin.   Ok to erx  5mg  tab?   [facility] [address]

## 2023-09-20 ENCOUNTER — Other Ambulatory Visit: Payer: Self-pay

## 2023-09-20 MED ORDER — OXYBUTYNIN CHLORIDE ER 5 MG PO TB24: 5.0000 mg | ORAL_TABLET | Freq: Every day | ORAL | 0 refills | Status: DC

## 2023-09-26 DIAGNOSIS — G4733 Obstructive sleep apnea (adult) (pediatric): Secondary | ICD-10-CM | POA: Diagnosis not present

## 2023-10-02 DIAGNOSIS — G4733 Obstructive sleep apnea (adult) (pediatric): Secondary | ICD-10-CM | POA: Diagnosis not present

## 2023-10-17 ENCOUNTER — Telehealth: Payer: Self-pay | Admitting: Urology

## 2023-10-17 DIAGNOSIS — E291 Testicular hypofunction: Secondary | ICD-10-CM

## 2023-10-17 NOTE — Telephone Encounter (Signed)
 Patient called to request refill for Testosterone . This was previously prescribed by [facility], and he now needs refill. Pharmacy is [facility] in [place].

## 2023-10-19 MED ORDER — TESTOSTERONE CYPIONATE 200 MG/ML IM SOLN: INTRAMUSCULAR | 0 refills | Status: AC

## 2023-10-19 NOTE — Telephone Encounter (Signed)
 Patient called to see if Testosterone  refill was being sent to pharmacy. Please advise patient.

## 2023-10-19 NOTE — Telephone Encounter (Signed)
 Done

## 2023-10-19 NOTE — Addendum Note (Signed)
 Addended by: Juliett Eastburn P on: 10/19/2023 05:29 PM   Modules accepted: Orders

## 2023-10-20 ENCOUNTER — Telehealth: Payer: Self-pay

## 2023-10-20 NOTE — Telephone Encounter (Signed)
 Pt called to set up an appointment for a hip injection with Dr. Burnetta. I scheduled him for 11/02/23 at 9:45.

## 2023-10-23 ENCOUNTER — Encounter: Payer: Self-pay | Admitting: Urology

## 2023-11-02 ENCOUNTER — Encounter: Payer: Self-pay | Admitting: Sports Medicine

## 2023-11-02 ENCOUNTER — Ambulatory Visit: Admitting: Sports Medicine

## 2023-11-02 ENCOUNTER — Other Ambulatory Visit: Payer: Self-pay

## 2023-11-02 DIAGNOSIS — G8929 Other chronic pain: Secondary | ICD-10-CM

## 2023-11-02 DIAGNOSIS — M1612 Unilateral primary osteoarthritis, left hip: Secondary | ICD-10-CM | POA: Diagnosis not present

## 2023-11-02 DIAGNOSIS — M25552 Pain in left hip: Secondary | ICD-10-CM | POA: Diagnosis not present

## 2023-11-02 MED ORDER — LIDOCAINE HCL 1 % IJ SOLN
4.0000 mL | INTRAMUSCULAR | Status: AC | PRN
  Administered 2023-11-02: 4 mL

## 2023-11-02 MED ORDER — METHYLPREDNISOLONE ACETATE 40 MG/ML IJ SUSP
80.0000 mg | INTRAMUSCULAR | Status: AC | PRN
  Administered 2023-11-02: 80 mg via INTRA_ARTICULAR

## 2023-11-02 NOTE — Progress Notes (Addendum)
 Tyler Coleman - 68 y.o. male MRN [id number]  Date of birth: December 03, 1955  Office Visit Note: Visit Date: 11/02/2023 PCP: [facility] Referred by: Karenann Helms*  Subjective: Chief Complaint  Patient presents with   Left Hip - Pain   HPI: Tyler Coleman is a pleasant 68 y.o. male who presents today for acute on chronic left hip pain with advanced osteoarthritis.  Tyler Coleman has advanced OA of the left hip.  He previously saw Dr. Vernetta for evaluation of hip replacement.  He is not at a place financially to move forward with this surgery.  We did fit him in for a same-day hip injection back in December of last year which he states gave him excellent relief of his pain for about 8 to 9 months, his pain just recently over the last 1 to 2 weeks became exacerbated again.  He is having a hard time walking because of the pain.  He uses Tylenol  as needed with only mild relief.  Last A1c from labs 07/31/23: A1c = 5.6     Pertinent ROS were reviewed with the patient and found to be negative unless otherwise specified above in HPI.   Assessment & Plan: Visit Diagnoses:  1. Chronic left hip pain   2. Unilateral primary osteoarthritis, left hip    - 1 chronic with exacerbation - independent interpretation of x-ray ordered by different physician  Plan: Impression is chronic left hip pain with exacerbation of severe left hip osteoarthritis.  We discussed treatment options for the hip, he received excellent relief about 9 months ago from a previous hip injection.  Through shared decision making, we did proceed with ultrasound-guided left hip intra-articular injection, the patient tolerated well.  Advised on postinjection protocol.  We did have a lengthy discussion today regarding infrequent hip injections with routine activity to keep the hip mobilized versus moving forward with hip replacement.  If he continues to get 9 months or so of significant hip  relief, would favor infrequent injections as opposed to total hip arthroplasty, he is in agreement to this.  He will follow-up with me as needed.  We did have a discussion if the injections are no longer providing him significant relief or only short-term relief, that is when hip replacement would be further discussed.  Follow-up as needed.  Follow-up: Return if symptoms worsen or fail to improve.   Meds & Orders: No orders of the defined types were placed in this encounter.   Orders Placed This Encounter  Procedures   Large Joint Inj   US  Guided Needle Placement - No Linked Charges     Procedures: Large Joint Inj: L hip joint on 11/02/2023 10:31 AM Indications: pain Details: 22 G 3.5 in needle, ultrasound-guided anterior approach Medications: 4 mL lidocaine  1 %; 80 mg methylPREDNISolone  acetate 40 MG/ML Outcome: tolerated well, no immediate complications  Procedure: US -guided intra-articular hip injection, Left After discussion on risks/benefits/indications and informed verbal consent was obtained, a timeout was performed. Patient was lying supine on exam table. The hip was cleaned with betadine and alcohol swabs. Then utilizing ultrasound guidance, the patient's femoral head and neck junction was identified and subsequently injected with 4:2 lidocaine :depomedrol via an in-plane approach with ultrasound visualization of the injectate administered into the hip joint. Patient tolerated procedure well without immediate complications.  Procedure, treatment alternatives, risks and benefits explained, specific risks discussed. Consent was given by the patient. Immediately prior to procedure a time out was called to verify the  correct patient, procedure, equipment, support staff and site/side marked as required. Patient was prepped and draped in the usual sterile fashion.          Clinical History: MRI LUMBAR SPINE WITHOUT CONTRAST   TECHNIQUE: Multiplanar, multisequence MR imaging of the  lumbar spine was performed. No intravenous contrast was administered.   COMPARISON:  Lumbar MRI 03/19/2017. CT of the chest, abdomen and pelvis 01/16/2010.   FINDINGS: Segmentation: Transitional lumbosacral anatomy. In correlation with prior imaging, there are 12 rib-bearing thoracic type vertebral bodies and a transitional, nearly fully lumbarized S1 segment.This differs from the numbering applied to the previous lumbar MRI.   Alignment:  Physiologic.   Vertebrae: No worrisome osseous lesion, acute fracture or pars defect. Progressive multilevel endplate degenerative changes with prominent Modic 1 changes asymmetric to the left at L1-2. No evidence of discitis or osteomyelitis. The lumbar pedicles are diffusely short on a congenital basis. The visualized sacroiliac joints appear unremarkable.   Conus medullaris: Extends to the L1 level. The conus and cauda equina appear normal.   Paraspinal and other soft tissues: No significant paraspinal findings.   Disc levels:   Sagittal images demonstrate no significant disc space findings within the visualized lower thoracic spine.   L1-2: New loss of disc height with annular disc bulging, endplate osteophytes and endplate edema asymmetric to the left. Mild facet and ligamentous hypertrophy. Mild spinal stenosis with mild right and moderate left foraminal narrowing.   L2-3: New moderate loss of disc height with annular disc bulging and endplate osteophytes. Moderate facet and ligamentous hypertrophy. Resulting moderate multifactorial spinal stenosis with mild lateral recess, mild right and moderate left foraminal narrowing.   L3-4: Relatively preserved disc height at this level with moderate annular disc bulging, facet and ligamentous hypertrophy. Resulting mild multifactorial spinal stenosis with mild narrowing of the lateral recesses and left foramen.   L4-5: Mildly progressive loss of disc height with annular disc bulging and  endplate osteophytes asymmetric to the right. Mild facet and ligamentous hypertrophy. No significant spinal stenosis. There is mild right foraminal narrowing.   L5-S1: Chronic degenerative disc disease with loss of disc height and endplate osteophytes. There are remote postsurgical changes on the left. The spinal canal remains decompressed. There is mild chronic narrowing of the lateral recesses and right foramen.   S1-2: Progressive loss of disc height with annular disc bulging and endplate osteophytes. Mild bilateral facet hypertrophy. The spinal canal is patent. There is mild lateral recess and foraminal narrowing bilaterally.   IMPRESSION: 1. Transitional lumbosacral anatomy with a transitional, nearly fully lumbarized S1 segment. 2. No acute findings or clear explanation for the patient's symptoms. 3. 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 Prominent endplate degenerative changes at L1-2 and L2-3 may contribute to back pain. 4. Moderate multifactorial spinal stenosis at L2-3 with moderate left foraminal narrowing. 5. Mild multifactorial spinal stenosis at L1-2 and L3-4 with asymmetric left foraminal narrowing at both levels. 6. Chronic postsurgical changes and degenerative disc disease at L5-S1 with mild chronic narrowing of the lateral recesses and right foramen. 7. Progressive degenerative disc disease at S1-2 with mild lateral recess and foraminal narrowing bilaterally.     Electronically Signed   By: Elsie Perone M.D.   On: 04/23/2023 11:36  He reports that he quit smoking about 41 years ago. His smoking use included cigarettes. He started smoking about 51 years ago. He has never been exposed to tobacco smoke. He has quit using smokeless tobacco. No results  for input(s): HGBA1C, LABURIC in the last 8760 hours.  Objective:    Physical Exam  Gen: Well-appearing, in no acute distress; non-toxic CV: Well-perfused.  Warm.  Resp: Breathing unlabored on room air; no wheezing. Psych: Fluid speech in conversation; appropriate affect; normal thought process  Ortho Exam - Left hip: There is no redness swelling or effusion present.  There is marked internal versus external rotation with logroll.  Positive FADIR test.  There is reduced hip swing with ambulation.  Imaging:  *Independent review and interpretation of left hip x-ray from 01/25/2023 was performed by myself today.  X-rays demonstrate severe osteoarthritic change with bony sclerosis and a large inferior osteophyte off the inferomedial femoral head and neck juncture.  No acute fracture noted.  01/25/23: An AP pelvis and lateral left hip shows severe end-stage arthritis of left  hip with near bone-on-bone wear.  There is significant joint space  narrowing as well as flattening of the superior lateral femoral head.   There are periarticular osteophytes around the left hip.   Past Medical/Family/Surgical/Social History: Medications & Allergies reviewed per EMR, new medications updated. Patient Active Problem List   Diagnosis Date Noted   History of colonic polyps 06/01/2023   Unilateral primary osteoarthritis, left hip 01/25/2023   Dysphagia    Achalasia    S/P insertion of penile implant 12/22/2021   Erectile dysfunction due to arterial disease (HCC) 07/20/2021   Seborrheic keratoses 03/31/2019   Actinic keratosis 03/31/2019   Essential hypertension 02/17/2017   Alcohol use disorder, moderate, dependence (HCC) 03/16/2016   MDD (major depressive disorder), recurrent severe, without psychosis (HCC) 03/15/2016   Mixed hyperlipidemia 02/16/2016   Allergic rhinitis 05/13/2015   Testicular hypofunction 05/13/2015   Insomnia 05/13/2015   Past Medical History:  Diagnosis Date   Alcohol use disorder, moderate, dependence (HCC)    per pcp note in epic    (12-09-2021  pt stated last drank alcohol 6 months ago)   Anxiety    Arthritis    Complication  of anesthesia    12-09-2021  pt stated with ankle surgery in 1978 my lung froze and could not breath  told it was a medication that is no longer used)   DDD (degenerative disc disease), lumbar    Delayed gastric emptying    Depression    Drug induced constipation    chronic   ED (erectile dysfunction)    Esophageal achalasia    DG esophagus imaging 09-24-2021--  seen by surgeon dr stevie, pt is scheduled for esophageal manometry 03-23-2022   GERD (gastroesophageal reflux disease)    Hypertension    Hypogonadism in male    Mixed hyperlipidemia    Opioid dependence on agonist therapy (HCC)    followed by pcp---   twice daily suboxone    Polysubstance abuse (HCC)    cocaince, heroin, fentanyl , benzodiazepines, opiate addiction  (12-09-2021  pt stated stopped at age 52 but every 10 yrs ago he binge's,  stated last drug use was heroin IV narcon used by EMS (ED visit in epic)   Sleep apnea    Family History  Problem Relation Age of Onset   Heart failure Mother    Heart failure Father    Mental illness Neg Hx    Past Surgical History:  Procedure Laterality Date   ANKLE SURGERY Right 1978   per pt for fracture, has retained hardware   BACK SURGERY     COLONOSCOPY WITH PROPOFOL   06/22/2021   by dr shila   ESOPHAGEAL  MANOMETRY N/A 01/05/2022   Procedure: ESOPHAGEAL MANOMETRY (EM);  Surgeon: Shila Gustav GAILS, MD;  Location: [facility];  Service: Gastroenterology;  Laterality: N/A;   ESOPHAGOGASTRODUODENOSCOPY (EGD) WITH ESOPHAGEAL DILATION  2020   LUMBAR SPINE SURGERY     1980  and 1985;  including L4-5 lamenctomy   NASAL SINUS SURGERY     1990s   PENILE PROSTHESIS IMPLANT N/A 12/22/2021   Procedure: INSERTION OF MALLEABLE PENILE PROSTHESIS;  Surgeon: Lovie Arlyss CROME, MD;  Location: [facility];  Service: Urology;  Laterality: N/A;   PENILE PROSTHESIS IMPLANT N/A 12/13/2022   Procedure: REMOVAL AND REPLACEMENT OF MALLEABLE PENILE IMPLANT;  Surgeon: Lovie Arlyss CROME, MD;  Location: [facility];  Service: Urology;  Laterality: N/A;  90 MINUTES NEEDED FOR CASE   SHOULDER SURGERY Bilateral    1995 and 2000   Social History   Occupational History   Not on file  Tobacco Use   Smoking status: Former    Current packs/day: 0.00    Types: Cigarettes    Start date: 73    Quit date: 1984    Years since quitting: 41.6    Passive exposure: Never   Smokeless tobacco: Former  Building services engineer status: Never Used  Substance and Sexual Activity   Alcohol use: Not Currently    Comment: last use alcohol- 3-4 months ago   Drug use: Not Currently    Types: Cocaine, Heroin, Other-see comments, Fentanyl , Benzodiazepines    Comment: last used 2 weeks ago - - ice or meth?   Sexual activity: Not on file

## 2023-11-02 NOTE — Progress Notes (Signed)
 Patient says that he would like another injection for his left hip. He has pain in the front of the leg to the knee, and has previously gotten relief only from the injection. Other treatments have not been helpful. He says that he got about 8 months of complete relief from the last injection. He says that he will get hip surgery, but is not financially able to do that at this time.

## 2023-11-30 ENCOUNTER — Other Ambulatory Visit: Payer: Self-pay

## 2023-11-30 DIAGNOSIS — N521 Erectile dysfunction due to diseases classified elsewhere: Secondary | ICD-10-CM

## 2023-11-30 DIAGNOSIS — E291 Testicular hypofunction: Secondary | ICD-10-CM

## 2023-11-30 DIAGNOSIS — R351 Nocturia: Secondary | ICD-10-CM

## 2023-12-01 ENCOUNTER — Other Ambulatory Visit

## 2023-12-01 DIAGNOSIS — E291 Testicular hypofunction: Secondary | ICD-10-CM

## 2023-12-01 DIAGNOSIS — N521 Erectile dysfunction due to diseases classified elsewhere: Secondary | ICD-10-CM

## 2023-12-02 LAB — CBC WITH DIFFERENTIAL/PLATELET
Basophils Absolute: 0 x10E3/uL (ref 0.0–0.2)
Basos: 1 %
EOS (ABSOLUTE): 0.3 x10E3/uL (ref 0.0–0.4)
Eos: 5 %
Hematocrit: 39.8 % (ref 37.5–51.0)
Hemoglobin: 12.7 g/dL — ABNORMAL LOW (ref 13.0–17.7)
Immature Grans (Abs): 0.1 x10E3/uL (ref 0.0–0.1)
Immature Granulocytes: 1 %
Lymphocytes Absolute: 1.1 x10E3/uL (ref 0.7–3.1)
Lymphs: 17 %
MCH: 27.6 pg (ref 26.6–33.0)
MCHC: 31.9 g/dL (ref 31.5–35.7)
MCV: 87 fL (ref 79–97)
Monocytes Absolute: 0.6 x10E3/uL (ref 0.1–0.9)
Monocytes: 9 %
Neutrophils Absolute: 4.4 x10E3/uL (ref 1.4–7.0)
Neutrophils: 67 %
Platelets: 205 x10E3/uL (ref 150–450)
RBC: 4.6 x10E6/uL (ref 4.14–5.80)
RDW: 13.2 % (ref 11.6–15.4)
WBC: 6.5 x10E3/uL (ref 3.4–10.8)

## 2023-12-02 LAB — TESTOSTERONE: Testosterone: 301 ng/dL (ref 264–916)

## 2023-12-02 LAB — PSA: Prostate Specific Ag, Serum: 1.5 ng/mL (ref 0.0–4.0)

## 2023-12-05 ENCOUNTER — Ambulatory Visit: Admitting: Urology

## 2023-12-07 ENCOUNTER — Ambulatory Visit: Admitting: Urology

## 2023-12-11 NOTE — Progress Notes (Deleted)
 12/12/2023 7:51 PM   Tyler Coleman [birth year] [id number]  Referring provider: [facility] [address]  [place] [address]  Urological history: 1. ED - malleable penile prosthesis (2023) - revised (2024)   2. BPH with LU TS - PSA (11/2023) 1.5 ~ corrected 3.0 - finasteride 5 mg daily  - oxybutynin  ER 5 mg  - Uroxatral patch   3. Hypogonadism - testosterone  level (11/2023) 301 - hemoglobin/hematocrit (11/2023) 12.7/39.8 - testosterone  cypionate 200 mg/mL, 0.3 cc every 7 days   No chief complaint on file.  HPI: Tyler Coleman is a 68 y.o. man who presents today for 6 month follow up.    Previous records reviewed.  He has a history of polysubstance abuse which may have contributed to his hypogonadism.    He reports good adherence to testosterone  cypionate 200 mg/mL, 0.3 cc every 7 days Denies new complaints of low libido, erectile dysfunction, fatigue, or mood changes.  No complaints of gynecomastia, visual changes, or thromboembolic symptoms.  Energy level, libido and overall sense of wellbeing being reported as stable/ improved compared to prior visit.   ***  I PSS ***  He reports sensation of incomplete bladder emptying, urinary frequency, urinary intermittency, urinary urgency, a weak urinary stream, having to strain to void, nocturia x ***, leaking before being able to reach the restroom, leaking with coughing, leaking without awareness, and post void dribbling.     He is wearing *** pads//depends  daily.    Patient denies any modifying or aggravating factors.  Patient denies any recent UTI's, gross hematuria, dysuria or suprapubic/flank pain.  Patient denies any fevers, chills, nausea or vomiting.  ***  He has a family history of PCa, colon cancer, ovarian cancer and/or breast cancer with ***.   He does not have a family history of PCa, colon cancer, ovarian cancer, and/or breast cancer .***      UA***  PVR***  PSA (11/2023) 1.5   Serum creatinine (07/2023) 0.75, eGFR > 90  Hemoglobin A1c (07/2023) 5.6  Fluid consumptiom: ***   PMH: Past Medical History:  Diagnosis Date   Alcohol use disorder, moderate, dependence (HCC)    per pcp note in epic    (12-09-2021  pt stated last drank alcohol 6 months ago)   Anxiety    Arthritis    Complication of anesthesia    12-09-2021  pt stated with ankle surgery in 1978 my lung froze and could not breath  told it was a medication that is no longer used)   DDD (degenerative disc disease), lumbar    Delayed gastric emptying    Depression    Drug induced constipation    chronic   ED (erectile dysfunction)    Esophageal achalasia    DG esophagus imaging 09-24-2021--  seen by surgeon dr stevie, pt is scheduled for esophageal manometry 03-23-2022   GERD (gastroesophageal reflux disease)    Hypertension    Hypogonadism in male    Mixed hyperlipidemia    Opioid dependence on agonist therapy (HCC)    followed by pcp---   twice daily suboxone    Polysubstance abuse (HCC)    cocaince, heroin, fentanyl , benzodiazepines, opiate addiction  (12-09-2021  pt stated stopped at age 51 but every 10 yrs ago he binge's,  stated last drug use was heroin IV narcon used by EMS (ED visit in epic)   Sleep apnea     Surgical History: Past Surgical History:  Procedure Laterality Date  ANKLE SURGERY Right 1978   per pt for fracture, has retained hardware   BACK SURGERY     COLONOSCOPY WITH PROPOFOL   06/22/2021   by dr shila   ESOPHAGEAL MANOMETRY N/A 01/05/2022   Procedure: ESOPHAGEAL MANOMETRY (EM);  Surgeon: shila Gustav GAILS, MD;  Location: [facility];  Service: Gastroenterology;  Laterality: N/A;   ESOPHAGOGASTRODUODENOSCOPY (EGD) WITH ESOPHAGEAL DILATION  2020   LUMBAR SPINE SURGERY     1980  and 1985;  including L4-5 lamenctomy   NASAL SINUS SURGERY     1990s   PENILE PROSTHESIS IMPLANT N/A 12/22/2021   Procedure: INSERTION  OF MALLEABLE PENILE PROSTHESIS;  Surgeon: Lovie Arlyss CROME, MD;  Location: [facility];  Service: Urology;  Laterality: N/A;   PENILE PROSTHESIS IMPLANT N/A 12/13/2022   Procedure: REMOVAL AND REPLACEMENT OF MALLEABLE PENILE IMPLANT;  Surgeon: Lovie Arlyss CROME, MD;  Location: [facility];  Service: Urology;  Laterality: N/A;  90 MINUTES NEEDED FOR CASE   SHOULDER SURGERY Bilateral    1995 and 2000    Home Medications:  Allergies as of 12/12/2023       Reactions   Augmentin [amoxicillin-pot Clavulanate] Other (See Comments)   Per pt severe burning of throat , it got stuck does not want it again   Doxycycline    Unknown reaction per pcp note   Erythromycin    Unknown reaction per pcp note   Thorazine [chlorpromazine] Swelling   My throat swelled        Medication List        Accurate as of December 11, 2023  7:51 PM. If you have any questions, ask your nurse or doctor.          acyclovir ointment 5 % Commonly known as: ZOVIRAX Apply topically. Apply topically Every 3 (three) hours while awake. Prn   AZO CRANBERRY PO Take by mouth.   benazepril  10 MG tablet Commonly known as: LOTENSIN  Take 10 mg by mouth daily.   citalopram  20 MG tablet Commonly known as: CELEXA  Take 30 mg by mouth daily.   clonazePAM  0.5 MG tablet Commonly known as: KLONOPIN  Take 0.5 mg by mouth 3 (three) times daily.   clotrimazole-betamethasone cream Commonly known as: LOTRISONE Apply 1 Application topically 2 (two) times daily.   fluticasone 50 MCG/ACT nasal spray Commonly known as: FLONASE Place 1 spray into both nostrils daily as needed for allergies.   Gemtesa  75 MG Tabs Generic drug: Vibegron  Take 1 tablet (75 mg total) by mouth daily.   ketoconazole 2 % cream Commonly known as: NIZORAL Apply 1 Application topically daily as needed for irritation.   Linzess 290 MCG Caps capsule Generic drug: linaclotide Take 290 mcg by mouth daily as needed (constipation).    meclizine 25 MG tablet Commonly known as: ANTIVERT Take 25 mg by mouth 3 (three) times daily as needed.   oxybutynin  3.9 MG/24HR Commonly known as: OXYTROL  Place 1 patch onto the skin every 3 (three) days.   oxybutynin  5 MG 24 hr tablet Commonly known as: DITROPAN -XL Take 1 tablet (5 mg total) by mouth at bedtime.   pantoprazole  40 MG tablet Commonly known as: PROTONIX  Take 80 mg by mouth daily.   pramipexole 0.125 MG tablet Commonly known as: MIRAPEX Take 0.125 mg by mouth daily.   tamsulosin 0.4 MG Caps capsule Commonly known as: FLOMAX Take 0.4 mg by mouth at bedtime.   testosterone  cypionate 200 MG/ML injection Commonly known as: Depo-Testosterone  Inject 0.3 ml once per week into  the muscle.        Allergies:  Allergies  Allergen Reactions   Augmentin [Amoxicillin-Pot Clavulanate] Other (See Comments)    Per pt severe burning of throat , it got stuck does not want it again   Doxycycline     Unknown reaction per pcp note   Erythromycin     Unknown reaction per pcp note   Thorazine [Chlorpromazine] Swelling    My throat swelled    Family History: Family History  Problem Relation Age of Onset   Heart failure Mother    Heart failure Father    Mental illness Neg Hx     Social History:  reports that he quit smoking about 41 years ago. His smoking use included cigarettes. He started smoking about 51 years ago. He has never been exposed to tobacco smoke. He has quit using smokeless tobacco. He reports that he does not currently use alcohol. He reports that he does not currently use drugs after having used the following drugs: Cocaine, Heroin, Other-see comments, Fentanyl , and Benzodiazepines.  ROS: Pertinent ROS in HPI  Physical Exam: There were no vitals taken for this visit.  Constitutional:  Well nourished. Alert and oriented, No acute distress. HEENT: [place] AT, moist mucus membranes.  Trachea midline, no masses. Cardiovascular: No clubbing, cyanosis,  or edema. Respiratory: Normal respiratory effort, no increased work of breathing. GI: Abdomen is soft, non tender, non distended, no abdominal masses. Liver and spleen not palpable.  No hernias appreciated.  Stool sample for occult testing is not indicated.   GU: No CVA tenderness.  No bladder fullness or masses.  Patient with circumcised/uncircumcised phallus. ***Foreskin easily retracted***  Urethral meatus is patent.  No penile discharge. No penile lesions or rashes. Scrotum without lesions, cysts, rashes and/or edema.  Testicles are located scrotally bilaterally. No masses are appreciated in the testicles. Left and right epididymis are normal. Rectal: Patient with  normal sphincter tone. Anus and perineum without scarring or rashes. No rectal masses are appreciated. Prostate is approximately *** grams, *** nodules are appreciated. Seminal vesicles are normal. Skin: No rashes, bruises or suspicious lesions. Lymph: No cervical or inguinal adenopathy. Neurologic: Grossly intact, no focal deficits, moving all 4 extremities. Psychiatric: Normal mood and affect.  Laboratory Data: Lab Results  Component Value Date   WBC 6.5 12/01/2023   HGB 12.7 (L) 12/01/2023   HCT 39.8 12/01/2023   MCV 87 12/01/2023   PLT 205 12/01/2023    Lab Results  Component Value Date   CREATININE 0.94 02/22/2022    Lab Results  Component Value Date   TESTOSTERONE  301 12/01/2023  I have reviewed the labs. See HPI.     Pertinent Imaging: N/A  Assessment & Plan:  ***  1. ED - ***  2. BPH with LU TS - stable, improving, worsening mild, moderate severe symptoms *** - no signs of retention, infection or malignancy *** - PSA up to date *** - DRE benign *** - UA benign *** - PVR < 300 cc *** - most bothersome symptoms are *** - encouraged avoiding bladder irritants, fluid restriction before bedtime and timed voiding's - Initiate alpha-blocker (***), discussed side effects *** - Initiate 5 alpha reductase  inhibitor (***), discussed side effects *** - Continue tamsulosin 0.4 mg daily, alfuzosin 10 mg daily, Rapaflo 8 mg daily, terazosin, doxazosin, Cialis 5 mg daily and finasteride 5 mg daily, dutasteride 0.5 mg daily***:refills given - Cannot tolerate medication or medication failure, schedule cystoscopy *** - educated on red flag  symptoms: acute retention, gross hematuria, fever, severe pain - advised to call clinic or go to the ED if these occur - return to clinic in *** symptom re-evaluation ***  3. Hypogonadism  -testosterone  levels are subtherapeutic -H & H - hemoglobin is low - encouraged to follow up with PCP to make sure he is up to date on screenings -continue testosterone  cypionate 200 mg/mL, 0.3 cc every 7 days ***   No follow-ups on file.  These notes generated with voice recognition software. I apologize for typographical errors.  CLOTILDA HELON RIGGERS  [facility] [address]  [address] [place], [place] [address] [phone]

## 2023-12-12 ENCOUNTER — Ambulatory Visit: Admitting: Urology

## 2023-12-12 DIAGNOSIS — N138 Other obstructive and reflux uropathy: Secondary | ICD-10-CM

## 2023-12-12 DIAGNOSIS — N529 Male erectile dysfunction, unspecified: Secondary | ICD-10-CM

## 2023-12-12 DIAGNOSIS — E291 Testicular hypofunction: Secondary | ICD-10-CM

## 2023-12-24 NOTE — Progress Notes (Unsigned)
 12/26/2023 9:44 PM   Tyler Coleman Dec 24, 1955 [id number]  Referring provider: [facility] [address]  [place] [address]  Urological history: 1. ED - malleable penile prosthesis (2023) - revised (2024)   2. BPH with LU TS - PSA (11/2023) 1.5 ~ corrected 3.0 - finasteride 5 mg daily  - oxybutynin  ER 5 mg  - Uroxatral patch   3. Hypogonadism - testosterone  level (11/2023) 301 - hemoglobin/hematocrit (11/2023) 12.7/39.8 - testosterone  cypionate 200 mg/mL, 0.3 cc every 7 days   No chief complaint on file.  HPI: Tyler Coleman is a 68 y.o. man who presents today for 6 month follow up.    Previous records reviewed.  He has a history of polysubstance abuse which may have contributed to his hypogonadism.    He reports good adherence to testosterone  cypionate 200 mg/mL, 0.3 cc every 7 days Denies new complaints of low libido, erectile dysfunction, fatigue, or mood changes.  No complaints of gynecomastia, visual changes, or thromboembolic symptoms.  Energy level, libido and overall sense of wellbeing being reported as stable/ improved compared to prior visit.   ***  I PSS ***  He reports sensation of incomplete bladder emptying, urinary frequency, urinary intermittency, urinary urgency, a weak urinary stream, having to strain to void, nocturia x ***, leaking before being able to reach the restroom, leaking with coughing, leaking without awareness, and post void dribbling.     He is wearing *** pads//depends  daily.    Patient denies any modifying or aggravating factors.  Patient denies any recent UTI's, gross hematuria, dysuria or suprapubic/flank pain.  Patient denies any fevers, chills, nausea or vomiting.  ***  He has a family history of PCa, colon cancer, ovarian cancer and/or breast cancer with ***.   He does not have a family history of PCa, colon cancer, ovarian cancer, and/or breast cancer .***      UA***  PVR***  PSA (11/2023) 1.5   Serum creatinine (07/2023) 0.75, eGFR > 90  Hemoglobin A1c (07/2023) 5.6  Fluid consumptiom: ***   PMH: Past Medical History:  Diagnosis Date   Alcohol use disorder, moderate, dependence (HCC)    per pcp note in epic    (12-09-2021  pt stated last drank alcohol 6 months ago)   Anxiety    Arthritis    Complication of anesthesia    12-09-2021  pt stated with ankle surgery in 1978 my lung froze and could not breath  told it was a medication that is no longer used)   DDD (degenerative disc disease), lumbar    Delayed gastric emptying    Depression    Drug induced constipation    chronic   ED (erectile dysfunction)    Esophageal achalasia    DG esophagus imaging 09-24-2021--  seen by surgeon dr stevie, pt is scheduled for esophageal manometry 03-23-2022   GERD (gastroesophageal reflux disease)    Hypertension    Hypogonadism in male    Mixed hyperlipidemia    Opioid dependence on agonist therapy (HCC)    followed by pcp---   twice daily suboxone    Polysubstance abuse (HCC)    cocaince, heroin, fentanyl , benzodiazepines, opiate addiction  (12-09-2021  pt stated stopped at age 36 but every 10 yrs ago he binge's,  stated last drug use was heroin IV narcon used by EMS (ED visit in epic)   Sleep apnea     Surgical History: Past Surgical History:  Procedure Laterality Date  ANKLE SURGERY Right 1978   per pt for fracture, has retained hardware   BACK SURGERY     COLONOSCOPY WITH PROPOFOL   06/22/2021   by dr shila   ESOPHAGEAL MANOMETRY N/A 01/05/2022   Procedure: ESOPHAGEAL MANOMETRY (EM);  Surgeon: shila Gustav GAILS, MD;  Location: [facility];  Service: Gastroenterology;  Laterality: N/A;   ESOPHAGOGASTRODUODENOSCOPY (EGD) WITH ESOPHAGEAL DILATION  2020   LUMBAR SPINE SURGERY     1980  and 1985;  including L4-5 lamenctomy   NASAL SINUS SURGERY     1990s   PENILE PROSTHESIS IMPLANT N/A 12/22/2021   Procedure: INSERTION  OF MALLEABLE PENILE PROSTHESIS;  Surgeon: Lovie Arlyss CROME, MD;  Location: [facility];  Service: Urology;  Laterality: N/A;   PENILE PROSTHESIS IMPLANT N/A 12/13/2022   Procedure: REMOVAL AND REPLACEMENT OF MALLEABLE PENILE IMPLANT;  Surgeon: Lovie Arlyss CROME, MD;  Location: [facility];  Service: Urology;  Laterality: N/A;  90 MINUTES NEEDED FOR CASE   SHOULDER SURGERY Bilateral    1995 and 2000    Home Medications:  Allergies as of 12/26/2023       Reactions   Augmentin [amoxicillin-pot Clavulanate] Other (See Comments)   Per pt severe burning of throat , it got stuck does not want it again   Doxycycline    Unknown reaction per pcp note   Erythromycin    Unknown reaction per pcp note   Thorazine [chlorpromazine] Swelling   My throat swelled        Medication List        Accurate as of December 24, 2023  9:44 PM. If you have any questions, ask your nurse or doctor.          acyclovir ointment 5 % Commonly known as: ZOVIRAX Apply topically. Apply topically Every 3 (three) hours while awake. Prn   AZO CRANBERRY PO Take by mouth.   benazepril  10 MG tablet Commonly known as: LOTENSIN  Take 10 mg by mouth daily.   citalopram  20 MG tablet Commonly known as: CELEXA  Take 30 mg by mouth daily.   clonazePAM  0.5 MG tablet Commonly known as: KLONOPIN  Take 0.5 mg by mouth 3 (three) times daily.   clotrimazole-betamethasone cream Commonly known as: LOTRISONE Apply 1 Application topically 2 (two) times daily.   fluticasone 50 MCG/ACT nasal spray Commonly known as: FLONASE Place 1 spray into both nostrils daily as needed for allergies.   Gemtesa  75 MG Tabs Generic drug: Vibegron  Take 1 tablet (75 mg total) by mouth daily.   ketoconazole 2 % cream Commonly known as: NIZORAL Apply 1 Application topically daily as needed for irritation.   Linzess 290 MCG Caps capsule Generic drug: linaclotide Take 290 mcg by mouth daily as needed (constipation).    meclizine 25 MG tablet Commonly known as: ANTIVERT Take 25 mg by mouth 3 (three) times daily as needed.   oxybutynin  3.9 MG/24HR Commonly known as: OXYTROL  Place 1 patch onto the skin every 3 (three) days.   oxybutynin  5 MG 24 hr tablet Commonly known as: DITROPAN -XL Take 1 tablet (5 mg total) by mouth at bedtime.   pantoprazole  40 MG tablet Commonly known as: PROTONIX  Take 80 mg by mouth daily.   pramipexole 0.125 MG tablet Commonly known as: MIRAPEX Take 0.125 mg by mouth daily.   tamsulosin 0.4 MG Caps capsule Commonly known as: FLOMAX Take 0.4 mg by mouth at bedtime.   testosterone  cypionate 200 MG/ML injection Commonly known as: Depo-Testosterone  Inject 0.3 ml once per week into  the muscle.        Allergies:  Allergies  Allergen Reactions   Augmentin [Amoxicillin-Pot Clavulanate] Other (See Comments)    Per pt severe burning of throat , it got stuck does not want it again   Doxycycline     Unknown reaction per pcp note   Erythromycin     Unknown reaction per pcp note   Thorazine [Chlorpromazine] Swelling    My throat swelled    Family History: Family History  Problem Relation Age of Onset   Heart failure Mother    Heart failure Father    Mental illness Neg Hx     Social History:  reports that he quit smoking about 41 years ago. His smoking use included cigarettes. He started smoking about 51 years ago. He has never been exposed to tobacco smoke. He has quit using smokeless tobacco. He reports that he does not currently use alcohol. He reports that he does not currently use drugs after having used the following drugs: Cocaine, Heroin, Other-see comments, Fentanyl , and Benzodiazepines.  ROS: Pertinent ROS in HPI  Physical Exam: There were no vitals taken for this visit.  Constitutional:  Well nourished. Alert and oriented, No acute distress. HEENT: [place] AT, moist mucus membranes.  Trachea midline, no masses. Cardiovascular: No clubbing, cyanosis,  or edema. Respiratory: Normal respiratory effort, no increased work of breathing. GI: Abdomen is soft, non tender, non distended, no abdominal masses. Liver and spleen not palpable.  No hernias appreciated.  Stool sample for occult testing is not indicated.   GU: No CVA tenderness.  No bladder fullness or masses.  Patient with circumcised/uncircumcised phallus. ***Foreskin easily retracted***  Urethral meatus is patent.  No penile discharge. No penile lesions or rashes. Scrotum without lesions, cysts, rashes and/or edema.  Testicles are located scrotally bilaterally. No masses are appreciated in the testicles. Left and right epididymis are normal. Rectal: Patient with  normal sphincter tone. Anus and perineum without scarring or rashes. No rectal masses are appreciated. Prostate is approximately *** grams, *** nodules are appreciated. Seminal vesicles are normal. Skin: No rashes, bruises or suspicious lesions. Lymph: No cervical or inguinal adenopathy. Neurologic: Grossly intact, no focal deficits, moving all 4 extremities. Psychiatric: Normal mood and affect.  Laboratory Data: Lab Results  Component Value Date   WBC 6.5 12/01/2023   HGB 12.7 (L) 12/01/2023   HCT 39.8 12/01/2023   MCV 87 12/01/2023   PLT 205 12/01/2023    Lab Results  Component Value Date   CREATININE 0.94 02/22/2022    Lab Results  Component Value Date   TESTOSTERONE  301 12/01/2023  I have reviewed the labs. See HPI.     Pertinent Imaging: N/A  Assessment & Plan:  ***  1. ED - ***  2. BPH with LU TS - stable, improving, worsening mild, moderate severe symptoms *** - no signs of retention, infection or malignancy *** - PSA up to date *** - DRE benign *** - UA benign *** - PVR < 300 cc *** - most bothersome symptoms are *** - encouraged avoiding bladder irritants, fluid restriction before bedtime and timed voiding's - Initiate alpha-blocker (***), discussed side effects *** - Initiate 5 alpha reductase  inhibitor (***), discussed side effects *** - Continue tamsulosin 0.4 mg daily, alfuzosin 10 mg daily, Rapaflo 8 mg daily, terazosin, doxazosin, Cialis 5 mg daily and finasteride 5 mg daily, dutasteride 0.5 mg daily***:refills given - Cannot tolerate medication or medication failure, schedule cystoscopy *** - educated on red flag  symptoms: acute retention, gross hematuria, fever, severe pain - advised to call clinic or go to the ED if these occur - return to clinic in *** symptom re-evaluation ***  3. Hypogonadism  -testosterone  levels are subtherapeutic -H & H - hemoglobin is low - encouraged to follow up with PCP to make sure he is up to date on screenings -continue testosterone  cypionate 200 mg/mL, 0.3 cc every 7 days ***   No follow-ups on file.  These notes generated with voice recognition software. I apologize for typographical errors.  CLOTILDA HELON RIGGERS  [facility] [address]  [address] [place], [place] [address] [phone]

## 2023-12-26 ENCOUNTER — Encounter: Payer: Self-pay | Admitting: Urology

## 2023-12-26 ENCOUNTER — Ambulatory Visit: Admitting: Urology

## 2023-12-26 VITALS — BP 125/72 | HR 70 | Ht 71.0 in | Wt 221.4 lb

## 2023-12-26 DIAGNOSIS — N401 Enlarged prostate with lower urinary tract symptoms: Secondary | ICD-10-CM | POA: Diagnosis not present

## 2023-12-26 DIAGNOSIS — N138 Other obstructive and reflux uropathy: Secondary | ICD-10-CM | POA: Diagnosis not present

## 2023-12-26 DIAGNOSIS — N529 Male erectile dysfunction, unspecified: Secondary | ICD-10-CM | POA: Diagnosis not present

## 2023-12-26 DIAGNOSIS — I779 Disorder of arteries and arterioles, unspecified: Secondary | ICD-10-CM

## 2023-12-26 DIAGNOSIS — E291 Testicular hypofunction: Secondary | ICD-10-CM | POA: Diagnosis not present

## 2023-12-26 DIAGNOSIS — Z969 Presence of functional implant, unspecified: Secondary | ICD-10-CM

## 2023-12-26 MED ORDER — BD DISP NEEDLES 21G X 1-1/2" MISC: 1.0000 mg | 0 refills | Status: AC

## 2023-12-26 MED ORDER — BD DISP NEEDLES 18G X 1-1/2" MISC: 1.0000 mg | 0 refills | Status: AC

## 2023-12-26 MED ORDER — SYRINGE 2-3 ML 3 ML MISC: 1.0000 mg | 3 refills | Status: AC

## 2024-01-26 ENCOUNTER — Other Ambulatory Visit: Payer: Self-pay | Admitting: Physical Medicine and Rehabilitation

## 2024-01-26 ENCOUNTER — Telehealth: Payer: Self-pay | Admitting: Physical Medicine and Rehabilitation

## 2024-01-26 DIAGNOSIS — G894 Chronic pain syndrome: Secondary | ICD-10-CM

## 2024-01-26 DIAGNOSIS — M5416 Radiculopathy, lumbar region: Secondary | ICD-10-CM

## 2024-01-26 DIAGNOSIS — M48062 Spinal stenosis, lumbar region with neurogenic claudication: Secondary | ICD-10-CM

## 2024-01-26 NOTE — Telephone Encounter (Signed)
 Pt called stating he would like an epideral injection. Call back number is [phone]

## 2024-01-26 NOTE — Progress Notes (Signed)
 I placed order for injection in our office. If he wishes to have injection before we can offer, he can go to [facility].

## 2024-02-05 ENCOUNTER — Other Ambulatory Visit: Payer: Self-pay

## 2024-02-05 ENCOUNTER — Telehealth: Payer: Self-pay | Admitting: Urology

## 2024-02-05 MED ORDER — OXYBUTYNIN CHLORIDE ER 5 MG PO TB24: 5.0000 mg | ORAL_TABLET | Freq: Every day | ORAL | 0 refills | Status: AC

## 2024-02-05 NOTE — Telephone Encounter (Signed)
 Pt LMOM asking for a refill for Oxybutynin  to be send to [facility] on [address] In [place].

## 2024-02-20 ENCOUNTER — Other Ambulatory Visit: Payer: Self-pay

## 2024-02-20 ENCOUNTER — Ambulatory Visit: Admitting: Physical Medicine and Rehabilitation

## 2024-02-20 VITALS — BP 135/77 | HR 59

## 2024-02-20 DIAGNOSIS — M5416 Radiculopathy, lumbar region: Secondary | ICD-10-CM

## 2024-02-20 MED ORDER — METHYLPREDNISOLONE ACETATE 40 MG/ML IJ SUSP
40.0000 mg | Freq: Once | INTRAMUSCULAR | Status: AC
  Administered 2024-02-20: 40 mg

## 2024-02-20 NOTE — Progress Notes (Unsigned)
 Pain Scale   Average Pain 7 Patient advising he has chronic lower back pain that increases when walking and decreasing when sitting.        +Driver, -BT, -Dye Allergies.

## 2024-02-21 NOTE — Procedures (Signed)
 Lumbosacral Transforaminal Epidural Steroid Injection - Sub-Pedicular Approach with Fluoroscopic Guidance  Patient: Tyler Coleman      Date of Birth: [birth year] MRN: [id number] [facility]: [facility]      Visit Date: 02/20/2024   Universal Protocol:    Date/Time: 02/20/2024  Consent Given By: the patient  Position: PRONE  Additional Comments: Vital signs were monitored before and after the procedure. Patient was prepped and draped in the usual sterile fashion. The correct patient, procedure, and site was verified.   Injection Procedure Details:   Procedure diagnoses: Lumbar radiculopathy [M54.16]    Meds Administered:  Meds ordered this encounter  Medications   methylPREDNISolone  acetate (DEPO-MEDROL ) injection 40 mg    Laterality: Bilateral  Location/Site: L3  Needle:5.0 in., 22 ga.  Short bevel or Quincke spinal needle  Needle Placement: Transforaminal  Findings:    -Comments: Excellent flow of contrast along the nerve, nerve root and into the epidural space.  Procedure Details: After squaring off the end-plates to get a true AP view, the C-arm was positioned so that an oblique view of the foramen as noted above was visualized. The target area is just inferior to the nose of the scotty dog or sub pedicular. The soft tissues overlying this structure were infiltrated with 2-3 ml. of 1% Lidocaine  without Epinephrine .  The spinal needle was inserted toward the target using a trajectory view along the fluoroscope beam.  Under AP and lateral visualization, the needle was advanced so it did not puncture dura and was located close the 6 O'Clock position of the pedical in AP tracterory. Biplanar projections were used to confirm position. Aspiration was confirmed to be negative for CSF and/or blood. A 1-2 ml. volume of Isovue -250 was injected and flow of contrast was noted at each level. Radiographs were obtained for documentation purposes.    After attaining the desired flow of contrast documented above, a 0.5 to 1.0 ml test dose of 0.25% Marcaine  was injected into each respective transforaminal space.  The patient was observed for 90 seconds post injection.  After no sensory deficits were reported, and normal lower extremity motor function was noted,   the above injectate was administered so that equal amounts of the injectate were placed at each foramen (level) into the transforaminal epidural space.   Additional Comments:  The patient tolerated the procedure well Dressing: 2 x 2 sterile gauze and Band-Aid    Post-procedure details: Patient was observed during the procedure. Post-procedure instructions were reviewed.  Patient left the clinic in stable condition.

## 2024-02-21 NOTE — Progress Notes (Signed)
 Tyler Coleman - 68 y.o. male MRN [id number]  Date of birth: 27-Oct-1955  Office Visit Note: Visit Date: 02/20/2024 PCP: [facility] Referred by: Karenann Helms*  Subjective: Chief Complaint  Patient presents with   Lower Back - Pain   HPI:  Tyler Coleman is a 69 y.o. male who comes in today at the request of Duwaine Pouch, FNP for planned Bilateral L3-4 Lumbar Transforaminal epidural steroid injection with fluoroscopic guidance.  The patient has failed conservative care including home exercise, medications, time and activity modification.  This injection will be diagnostic and hopefully therapeutic.  Please see requesting physician notes for further details and justification.  Consider lower facet blocks.   ROS Otherwise per HPI.  Assessment & Plan: Visit Diagnoses:    ICD-10-CM   1. Lumbar radiculopathy  M54.16 XR C-ARM NO REPORT    Epidural Steroid injection    methylPREDNISolone  acetate (DEPO-MEDROL ) injection 40 mg      Plan: No additional findings.   Meds & Orders:  Meds ordered this encounter  Medications   methylPREDNISolone  acetate (DEPO-MEDROL ) injection 40 mg    Orders Placed This Encounter  Procedures   XR C-ARM NO REPORT   Epidural Steroid injection    Follow-up: Return for visit to requesting provider as needed.   Procedures: No procedures performed  Lumbosacral Transforaminal Epidural Steroid Injection - Sub-Pedicular Approach with Fluoroscopic Guidance  Patient: Tyler Coleman      Date of Birth: 11-Oct-1955 MRN: [id number] [facility]: [facility]      Visit Date: 02/20/2024   Universal Protocol:    Date/Time: 02/20/2024  Consent Given By: the patient  Position: PRONE  Additional Comments: Vital signs were monitored before and after the procedure. Patient was prepped and draped in the usual sterile fashion. The correct patient, procedure, and site was  verified.   Injection Procedure Details:   Procedure diagnoses: Lumbar radiculopathy [M54.16]    Meds Administered:  Meds ordered this encounter  Medications   methylPREDNISolone  acetate (DEPO-MEDROL ) injection 40 mg    Laterality: Bilateral  Location/Site: L3  Needle:5.0 in., 22 ga.  Short bevel or Quincke spinal needle  Needle Placement: Transforaminal  Findings:    -Comments: Excellent flow of contrast along the nerve, nerve root and into the epidural space.  Procedure Details: After squaring off the end-plates to get a true AP view, the C-arm was positioned so that an oblique view of the foramen as noted above was visualized. The target area is just inferior to the nose of the scotty dog or sub pedicular. The soft tissues overlying this structure were infiltrated with 2-3 ml. of 1% Lidocaine  without Epinephrine .  The spinal needle was inserted toward the target using a trajectory view along the fluoroscope beam.  Under AP and lateral visualization, the needle was advanced so it did not puncture dura and was located close the 6 O'Clock position of the pedical in AP tracterory. Biplanar projections were used to confirm position. Aspiration was confirmed to be negative for CSF and/or blood. A 1-2 ml. volume of Isovue -250 was injected and flow of contrast was noted at each level. Radiographs were obtained for documentation purposes.   After attaining the desired flow of contrast documented above, a 0.5 to 1.0 ml test dose of 0.25% Marcaine  was injected into each respective transforaminal space.  The patient was observed for 90 seconds post injection.  After no sensory deficits were reported, and normal lower extremity motor function was noted,  the above injectate was administered so that equal amounts of the injectate were placed at each foramen (level) into the transforaminal epidural space.   Additional Comments:  The patient tolerated the procedure well Dressing: 2 x 2  sterile gauze and Band-Aid    Post-procedure details: Patient was observed during the procedure. Post-procedure instructions were reviewed.  Patient left the clinic in stable condition.    Clinical History: MRI LUMBAR SPINE WITHOUT CONTRAST   TECHNIQUE: Multiplanar, multisequence MR imaging of the lumbar spine was performed. No intravenous contrast was administered.   COMPARISON:  Lumbar MRI 03/19/2017. CT of the chest, abdomen and pelvis 01/16/2010.   FINDINGS: Segmentation: Transitional lumbosacral anatomy. In correlation with prior imaging, there are 12 rib-bearing thoracic type vertebral bodies and a transitional, nearly fully lumbarized S1 segment.This differs from the numbering applied to the previous lumbar MRI.   Alignment:  Physiologic.   Vertebrae: No worrisome osseous lesion, acute fracture or pars defect. Progressive multilevel endplate degenerative changes with prominent Modic 1 changes asymmetric to the left at L1-2. No evidence of discitis or osteomyelitis. The lumbar pedicles are diffusely short on a congenital basis. The visualized sacroiliac joints appear unremarkable.   Conus medullaris: Extends to the L1 level. The conus and cauda equina appear normal.   Paraspinal and other soft tissues: No significant paraspinal findings.   Disc levels:   Sagittal images demonstrate no significant disc space findings within the visualized lower thoracic spine.   L1-2: New loss of disc height with annular disc bulging, endplate osteophytes and endplate edema asymmetric to the left. Mild facet and ligamentous hypertrophy. Mild spinal stenosis with mild right and moderate left foraminal narrowing.   L2-3: New moderate loss of disc height with annular disc bulging and endplate osteophytes. Moderate facet and ligamentous hypertrophy. Resulting moderate multifactorial spinal stenosis with mild lateral recess, mild right and moderate left foraminal narrowing.    L3-4: Relatively preserved disc height at this level with moderate annular disc bulging, facet and ligamentous hypertrophy. Resulting mild multifactorial spinal stenosis with mild narrowing of the lateral recesses and left foramen.   L4-5: Mildly progressive loss of disc height with annular disc bulging and endplate osteophytes asymmetric to the right. Mild facet and ligamentous hypertrophy. No significant spinal stenosis. There is mild right foraminal narrowing.   L5-S1: Chronic degenerative disc disease with loss of disc height and endplate osteophytes. There are remote postsurgical changes on the left. The spinal canal remains decompressed. There is mild chronic narrowing of the lateral recesses and right foramen.   S1-2: Progressive loss of disc height with annular disc bulging and endplate osteophytes. Mild bilateral facet hypertrophy. The spinal canal is patent. There is mild lateral recess and foraminal narrowing bilaterally.   IMPRESSION: 1. Transitional lumbosacral anatomy with a transitional, nearly fully lumbarized S1 segment. 2. No acute findings or clear explanation for the patient's symptoms. 3. 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 Prominent endplate degenerative changes at L1-2 and L2-3 may contribute to back pain. 4. Moderate multifactorial spinal stenosis at L2-3 with moderate left foraminal narrowing. 5. Mild multifactorial spinal stenosis at L1-2 and L3-4 with asymmetric left foraminal narrowing at both levels. 6. Chronic postsurgical changes and degenerative disc disease at L5-S1 with mild chronic narrowing of the lateral recesses and right foramen. 7. Progressive degenerative disc disease at S1-2 with mild lateral recess and foraminal narrowing bilaterally.     Electronically Signed   By: Elsie Perone M.D.   On: 04/23/2023 11:36  Objective:  VS:  HT:    WT:   BMI:     BP:135/77  HR:(!) 59bpm   TEMP: ( )  RESP:  Physical Exam Vitals and nursing note reviewed.  Constitutional:      General: He is not in acute distress.    Appearance: Normal appearance. He is not ill-appearing.  HENT:     Head: Normocephalic and atraumatic.     Right Ear: External ear normal.     Left Ear: External ear normal.     Nose: No congestion.  Eyes:     Extraocular Movements: Extraocular movements intact.  Cardiovascular:     Rate and Rhythm: Normal rate.     Pulses: Normal pulses.  Pulmonary:     Effort: Pulmonary effort is normal. No respiratory distress.  Abdominal:     General: There is no distension.     Palpations: Abdomen is soft.  Musculoskeletal:        General: No tenderness or signs of injury.     Cervical back: Neck supple.     Right lower leg: No edema.     Left lower leg: No edema.     Comments: Patient has good distal strength without clonus.  Skin:    Findings: No erythema or rash.  Neurological:     General: No focal deficit present.     Mental Status: He is alert and oriented to person, place, and time.     Sensory: No sensory deficit.     Motor: No weakness or abnormal muscle tone.     Coordination: Coordination normal.  Psychiatric:        Mood and Affect: Mood normal.        Behavior: Behavior normal.      Imaging: XR C-ARM NO REPORT Result Date: 02/20/2024 Please see Notes tab for imaging impression.

## 2024-03-08 ENCOUNTER — Telehealth: Payer: Self-pay | Admitting: Physical Medicine and Rehabilitation

## 2024-03-08 NOTE — Telephone Encounter (Signed)
 Pt called stating that shoots did not helping and if we can't find something else to do he will find another doctor. Pt number is [phone].

## 2024-03-08 NOTE — Telephone Encounter (Signed)
 Called and advised patient of Dr. Lyda suggestions

## 2024-04-08 ENCOUNTER — Telehealth: Payer: Self-pay | Admitting: Physical Medicine and Rehabilitation

## 2024-04-08 ENCOUNTER — Telehealth: Payer: Self-pay

## 2024-04-08 NOTE — Telephone Encounter (Signed)
 Patient called and stated that the injections are not helping that much and he would like to be referred to a pain clinic.  He wants to be called with the information about the referral. He stated that he would still come in for injections if Dr. Eldonna thinks it would help him.  Please call him at [phone]

## 2024-04-08 NOTE — Telephone Encounter (Signed)
 Patient called. He would like to see Dr. Eldonna. Also he fell and would like some pain medication called in for him.

## 2024-04-09 ENCOUNTER — Other Ambulatory Visit: Payer: Self-pay

## 2024-04-09 ENCOUNTER — Other Ambulatory Visit: Payer: Self-pay | Admitting: Physical Medicine and Rehabilitation

## 2024-04-09 ENCOUNTER — Telehealth: Payer: Self-pay | Admitting: Physical Medicine and Rehabilitation

## 2024-04-09 ENCOUNTER — Emergency Department (HOSPITAL_BASED_OUTPATIENT_CLINIC_OR_DEPARTMENT_OTHER): Admitting: Radiology

## 2024-04-09 ENCOUNTER — Encounter (HOSPITAL_BASED_OUTPATIENT_CLINIC_OR_DEPARTMENT_OTHER): Payer: Self-pay

## 2024-04-09 ENCOUNTER — Emergency Department (HOSPITAL_BASED_OUTPATIENT_CLINIC_OR_DEPARTMENT_OTHER)
Admission: EM | Admit: 2024-04-09 | Discharge: 2024-04-09 | Disposition: A | Discharge status: Home / Self Care | Attending: Emergency Medicine | Admitting: Emergency Medicine

## 2024-04-09 DIAGNOSIS — M549 Dorsalgia, unspecified: Secondary | ICD-10-CM

## 2024-04-09 DIAGNOSIS — W000XXA Fall on same level due to ice and snow, initial encounter: Secondary | ICD-10-CM | POA: Insufficient documentation

## 2024-04-09 DIAGNOSIS — M545 Low back pain, unspecified: Secondary | ICD-10-CM | POA: Insufficient documentation

## 2024-04-09 DIAGNOSIS — Z5329 Procedure and treatment not carried out because of patient's decision for other reasons: Secondary | ICD-10-CM | POA: Insufficient documentation

## 2024-04-09 MED ORDER — KETOROLAC TROMETHAMINE 30 MG/ML IJ SOLN: 15.0000 mg | Freq: Once | INTRAMUSCULAR | Status: DC

## 2024-04-09 NOTE — Telephone Encounter (Signed)
 LMX1 and advised Patient that he could benefit from seeing MaryAnn Persons, at the [facility]. He had a fall and wanted to bed evaluated however, did not want to seen by [facility]

## 2024-04-09 NOTE — Telephone Encounter (Signed)
 Pt called saying that he canceled his apt for Fri, then called back and said he wanted to reschedule it because he couldn't get in anywhere else. When I looked in his chart I didn't see where it was rescheduled, so I told him this. He told me to have it rescheduled then hung up on me. I hop this is a good call back number since he hung up on me. Call back number is [phone].

## 2024-04-09 NOTE — ED Triage Notes (Signed)
 Presents to ED with c/o lower back pain for one week. States he fell yesterday and made the pain worse. Been taking motrin  with no relief. Ambulatory to triage.

## 2024-04-09 NOTE — ED Provider Notes (Signed)
 " [place] [facility]MENT AT [facility] Provider Note   CSN: [id number] Arrival date & time: 04/09/24  1030     Patient presents with: No chief complaint on file.   Tyler Coleman is a 69 y.o. male.   HPI 69 year old male presenting with back pain.  Patient reports that he fell yesterday on the ice and since then has had back pain.  He has tried taking Tylenol  and and ibuprofen  but it has not helped.  He reports the pain is a 9 out of 10.  He denies hitting his head.  He also reports that he has had some back pain for the past week after lifting his wife.  Patient does have a significant history for previous back surgery in the same area.  Patient denies any urinary or bowel incontinence.  He denies any numbness or tingling.  He also denies any weakness.    Prior to Admission medications  Medication Sig Start Date End Date Taking? Authorizing Provider  acyclovir ointment (ZOVIRAX) 5 % Apply topically. Apply topically Every 3 (three) hours while awake. Prn    [provider]  B-D 3CC LUER-LOK SYR 23GX1-1/2 23G X 1-1/2 3 ML MISC  05/29/19   [provider]  BD DISP NEEDLES 18G X 1-1/2 MISC  05/01/19   [provider]  benazepril  (LOTENSIN ) 10 MG tablet Take 10 mg by mouth daily.    [provider]  buPROPion  (WELLBUTRIN  XL) 150 MG 24 hr tablet Oral 06/01/22   [provider]  calcium carbonate (TUMS) 500 MG chewable tablet 1 tablet Orally As needed; Duration: 30 day(s)    [provider]  citalopram  (CELEXA ) 20 MG tablet Take 30 mg by mouth daily.    [provider]  clonazePAM  (KLONOPIN ) 0.5 MG tablet Take 0.5 mg by mouth 3 (three) times daily as needed. 12/17/23   [provider]  clotrimazole-betamethasone (LOTRISONE) cream Apply 1 Application topically 2 (two) times daily.    [provider]  Cranberry-Vitamin C-Probiotic (AZO CRANBERRY PO) Take by mouth.    [provider]   famotidine  (PEPCID ) 20 MG tablet 1 tablet at bedtime as needed Orally Once a day; Duration: 30 day(s)    [provider]  fluconazole  (DIFLUCAN ) 150 MG tablet Oral 06/01/22   [provider]  fluocinonide ointment (LIDEX) 0.05 % Apply 1 Application topically 2 (two) times daily.    [provider]  fluticasone (FLONASE) 50 MCG/ACT nasal spray Place 2 sprays into both nostrils daily. 12/17/23   [provider]  ketoconazole (NIZORAL) 2 % cream Apply 1 Application topically daily as needed for irritation.    [provider]  linaclotide (LINZESS) 290 MCG CAPS capsule Take 290 mcg by mouth daily as needed (constipation).    [provider]  Meclizine HCl 25 MG CHEW Chew 1 tablet by mouth 3 (three) times daily as needed. 08/08/23   [provider]  mupirocin ointment (BACTROBAN) 2 % Lightly coat INSIDE OF nostril 3 TIMES DAILY 06/01/22   [provider]  NEEDLE, DISP, 18 G (BD DISP NEEDLES) 18G X 1-1/2 MISC 1 mg by Does not apply route every 14 (fourteen) days. 12/26/23   McGowan, Clotilda A, PA-C  NEEDLE, DISP, 21 G (BD DISP NEEDLES) 21G X 1-1/2 MISC 1 mg by Does not apply route every 14 (fourteen) days. 12/26/23   Helon Clotilda A, PA-C  ondansetron  (ZOFRAN -ODT) 4 MG disintegrating tablet Oral 06/01/22   [provider]  oxybutynin  (  DITROPAN -XL) 5 MG 24 hr tablet Take 1 tablet (5 mg total) by mouth at bedtime. 02/05/24   Helon Kirsch A, PA-C  oxybutynin  (OXYTROL ) 3.9 MG/24HR Place 1 patch onto the skin every 3 (three) days.    [provider]  pantoprazole  (PROTONIX ) 40 MG tablet Take 80 mg by mouth daily.    [provider]  pramipexole (MIRAPEX) 0.125 MG tablet Take 0.125 mg by mouth daily. 02/08/23   [provider]  Syringe, Disposable, (2-3CC SYRINGE) 3 ML MISC 1 mg by Does not apply route every 14 (fourteen) days. 12/26/23   McGowan, Kirsch LABOR, PA-C  testosterone  cypionate  (DEPO-TESTOSTERONE ) 200 MG/ML injection Inject 0.3 ml once per week into the muscle. 10/19/23   Vaillancourt, Samantha, PA-C  traZODone  (DESYREL ) 50 MG tablet Take 50 mg by mouth at bedtime. 06/01/22   [provider]  triamcinolone  cream (KENALOG) 0.1 % APPLY TO AFFECTED AREA OF SKIN 2 TIMES DAILY 06/01/22   [provider]    Allergies: Augmentin [amoxicillin-pot clavulanate], Doxycycline, Erythromycin, and Thorazine [chlorpromazine]    Review of Systems  All other systems reviewed and are negative.   Updated Vital Signs BP (!) 140/79   Pulse 78   Temp 97.6 F (36.4 C)   Resp 16   Ht 5' 11 (1.803 m)   Wt 95.3 kg   SpO2 98%   BMI 29.29 kg/m   Physical Exam Vitals and nursing note reviewed.  Eyes:     General: No scleral icterus. Pulmonary:     Effort: Pulmonary effort is normal.  Musculoskeletal:     Cervical back: Normal.     Thoracic back: Normal.     Lumbar back: Tenderness and bony tenderness present. No swelling, edema, deformity or signs of trauma. Normal range of motion.       Back:     Comments: Tenderness to palpation over the lumbar region.  No swelling noted.  No signs of trauma noted.  Tenderness to palpation along the left and right sides of the spine.  Skin:    Coloration: Skin is not jaundiced.     Findings: No rash.  Neurological:     General: No focal deficit present.     Mental Status: He is alert.     (all labs ordered are listed, but only abnormal results are displayed) Labs Reviewed - No data to display  EKG: None  Radiology: No results found.   Procedures   Medications Ordered in the ED - No data to display                                  Medical Decision Making  Impression: 69 year old male presenting after a fall.  Differential diagnosis fracture, dislocation, muscle strain, muscle sprain, retroperitoneal bleed  Additional History: Patient was able to provide history.  Also reviewed other outpatient  notes.  Labs: None  Imaging: Lumbar spine x-ray.  ED Course/Meds: 69 year old male presenting with back pain after fall.  Patient was well-appearing and in no acute distress.  After speaking with the patient we discussed doing a x-ray to make sure nothing was broken and having him follow-up with his orthopedic specialist.  However before x-ray was completed patient left.  Patient left without me speaking to him about the risk and benefits of leaving.  Patient also left before receiving any pain medication.  I spoke with the nursing staff and they checked outside, in the  bathrooms, and in the lobby for the patient and he was nowhere to be found.      Final diagnoses:  None    ED Discharge Orders     None          Rosaline Almarie MATSU, [place] 04/09/24 1345  "

## 2024-04-09 NOTE — ED Notes (Signed)
 Pt might have left, not in radiology, not in bathroom, not in obs 3 either.

## 2024-04-12 ENCOUNTER — Ambulatory Visit: Admitting: Physical Medicine and Rehabilitation

## 2024-04-24 ENCOUNTER — Encounter: Payer: Self-pay | Admitting: Radiology

## 2024-04-24 NOTE — Progress Notes (Unsigned)
 I received a notification by fax from [facility].  Patient called into [facility] and reported a grievance against Dr Eldonna.    I called [employer] as requested by the fax to provide our perspective on this report.    Per [employer] rep, patient told [employer] that he had back pain from a fall, and Dr Eldonna would not see him immediately.  (The fall was on 04/08/24.  Patient tried to get appt on 04/08/24.)  He couldn't be seen until Friday.  I advised per the chart phone notes that patient had called us , reporting a fall, asking for appt w/ Dr Eldonna and for some pain meds.    Patient was scheduled for 04/12/24 w/ Dr Eldonna, after he refused to go to the UC or the ED.  Patient was also told by clinical asst and Duwaine Pouch, NP that we cannot give him meds w/o seeing him in clinic for evaluation.  Patient had then advised to cancel the appt on Fri 04/12/24, that he would figure it out.    Clinic asst called patient 04/09/24 to try and help him, and had to leave a message; advised he could be seen at [facility] if he would like.    Patient called back 04/09/24, and asked to have the appt w/ Dr Eldonna rescheduled.  Staff could not find his appt info, and he hung up on staff.    I can see per chart that patient went to the ED at [facility] on 04/09/24.  I did explain as well to the [facility] rep that Dr Eldonna is a physiatrist and he does not offer same day/walk in appts.

## 2024-06-18 ENCOUNTER — Other Ambulatory Visit

## 2024-06-25 ENCOUNTER — Ambulatory Visit: Admitting: Urology
